# Patient Record
Sex: Female | Born: 2000 | Race: White | Hispanic: No | Marital: Single | Smoking: Never smoker
Health system: Southern US, Community
[De-identification: ages and names within clinical notes are randomized; demographics above are authoritative.]

## PROBLEM LIST (undated history)

## (undated) DIAGNOSIS — G43909 Migraine, unspecified, not intractable, without status migrainosus: Secondary | ICD-10-CM

## (undated) DIAGNOSIS — Z9889 Other specified postprocedural states: Secondary | ICD-10-CM

## (undated) DIAGNOSIS — F909 Attention-deficit hyperactivity disorder, unspecified type: Secondary | ICD-10-CM

## (undated) DIAGNOSIS — Z973 Presence of spectacles and contact lenses: Secondary | ICD-10-CM

## (undated) DIAGNOSIS — K219 Gastro-esophageal reflux disease without esophagitis: Secondary | ICD-10-CM

## (undated) DIAGNOSIS — N809 Endometriosis, unspecified: Secondary | ICD-10-CM

## (undated) DIAGNOSIS — IMO0001 Reserved for inherently not codable concepts without codable children: Secondary | ICD-10-CM

## (undated) HISTORY — PX: WISDOM TOOTH EXTRACTION: SHX21

## (undated) HISTORY — PX: KNEE SURGERY: SHX244

## (undated) HISTORY — PX: NO PAST SURGERIES: SHX2092

---
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
## 2021-01-13 DIAGNOSIS — R55 Syncope and collapse: Secondary | ICD-10-CM | POA: Diagnosis not present

## 2021-01-13 DIAGNOSIS — R002 Palpitations: Secondary | ICD-10-CM | POA: Diagnosis not present

## 2021-01-13 DIAGNOSIS — G43709 Chronic migraine without aura, not intractable, without status migrainosus: Secondary | ICD-10-CM | POA: Diagnosis not present

## 2021-02-03 DIAGNOSIS — L700 Acne vulgaris: Secondary | ICD-10-CM | POA: Diagnosis not present

## 2021-02-03 DIAGNOSIS — L720 Epidermal cyst: Secondary | ICD-10-CM | POA: Diagnosis not present

## 2021-02-03 DIAGNOSIS — L988 Other specified disorders of the skin and subcutaneous tissue: Secondary | ICD-10-CM | POA: Diagnosis not present

## 2021-02-03 DIAGNOSIS — L74510 Primary focal hyperhidrosis, axilla: Secondary | ICD-10-CM | POA: Diagnosis not present

## 2021-02-06 DIAGNOSIS — J0100 Acute maxillary sinusitis, unspecified: Secondary | ICD-10-CM | POA: Diagnosis not present

## 2021-02-09 ENCOUNTER — Other Ambulatory Visit: Payer: Self-pay

## 2021-02-09 ENCOUNTER — Ambulatory Visit
Admission: EM | Admit: 2021-02-09 | Discharge: 2021-02-09 | Disposition: A | Discharge status: Home / Self Care | Payer: BC Managed Care – PPO

## 2021-02-09 DIAGNOSIS — R051 Acute cough: Secondary | ICD-10-CM

## 2021-02-09 MED ORDER — BENZONATATE 100 MG PO CAPS: 200.0000 mg | ORAL_CAPSULE | Freq: Three times a day (TID) | ORAL | 0 refills | Status: DC

## 2021-02-09 MED ORDER — IPRATROPIUM BROMIDE 0.06 % NA SOLN: 2.0000 | Freq: Four times a day (QID) | NASAL | 12 refills | Status: DC

## 2021-02-09 MED ORDER — PREDNISONE 10 MG (21) PO TBPK: ORAL_TABLET | ORAL | 0 refills | Status: DC

## 2021-02-09 NOTE — ED Triage Notes (Signed)
Pt reports cough x approx 10 days.  Was seen at [facility] last week and given augmentin and promethazine dm but it has not helped.  Also has some lingering nasal congestion. Denies fever, HA.  Had negative home COVID. Reports rib pain from coughing.

## 2021-02-09 NOTE — ED Provider Notes (Signed)
[facility]    CSN: [id number] Arrival date & time: 02/09/21  2703      History   Chief Complaint Chief Complaint  Patient presents with   Cough    HPI Paula Brock is a 20 y.o. female.   HPI  20 year old female here for evaluation of cough.  Patient was that she has been having a cough for the past 2 weeks.  Her cough is productive for a green sputum.  She is also having runny nose and nasal congestion with green nasal discharge.  She endorses a sore throat and some intermittent posttussive emesis.  Patient denies any fever, ear pain or pressure, shortness breath or wheezing, or GI complaints.  She was evaluated by [facility] and was placed on Augmentin and Promethazine DM cough syrup for maxillary sinusitis.  She tested negative at home for COVID.  Patient reports that the Promethazine DM is not helping her and she is continuing to cough all throughout the day.  Patient has had a mild, nonproductive cough during the history and physical.  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      Home Medications    Prior to Admission medications   Medication Sig Start Date End Date Taking? Authorizing Provider  amitriptyline (ELAVIL) 10 MG tablet Take by mouth. 01/03/20  Yes [provider]  amoxicillin-clavulanate  (AUGMENTIN) 875-125 MG tablet Take 1 tablet by mouth 2 (two) times daily.   Yes [provider]  benzonatate (TESSALON) 100 MG capsule Take 2 capsules (200 mg total) by mouth every 8 (eight) hours. 02/09/21  Yes Becky Augusta, NP  cetirizine (ZYRTEC) 10 MG tablet Take 10 mg by mouth daily.   Yes [provider]  ipratropium (ATROVENT) 0.06 % nasal spray Place 2 sprays into both nostrils 4 (four) times daily. 02/09/21  Yes Becky Augusta, NP  norethindrone-ethinyl estradiol (LOESTRIN) 1-20 MG-MCG tablet Take 1 tablet by mouth daily. 07/25/17  Yes [provider]  ondansetron (ZOFRAN-ODT) 4 MG disintegrating tablet Take 4 mg by mouth every 8 (eight) hours as needed. 03/03/18  Yes [provider]  predniSONE (STERAPRED UNI-PAK 21 TAB) 10 MG (21) TBPK tablet Take 6 tablets on day 1, 5 tablets day 2, 4 tablets day 3, 3 tablets day 4, 2 tablets day 5, 1 tablet day 6 02/09/21  Yes Becky Augusta, NP  promethazine-dextromethorphan (PROMETHAZINE-DM) 6.25-15 MG/5ML syrup Take by mouth 4 (four) times daily as needed for cough.   Yes [provider]  SUMAtriptan (IMITREX) 100 MG tablet Take 50 mg by mouth daily as needed. 11/14/17  Yes [provider]    Family History Family History  Problem Relation Age of Onset   Healthy Mother    Healthy Father     Social History Social History  Tobacco Use   Smoking status: Never   Smokeless tobacco: Never  Vaping Use   Vaping Use: Never used  Substance Use Topics   Alcohol use: No    Alcohol/week: 0.0 standard drinks   Drug use: Not Currently     Allergies   Latex   Review of Systems Review of Systems  Constitutional:  Negative for activity change, appetite change and fever.  HENT:  Positive for congestion, rhinorrhea and sore throat. Negative for ear pain.   Respiratory:  Positive for cough. Negative for shortness of breath and wheezing.   Gastrointestinal:  Negative for constipation, diarrhea, nausea  and vomiting.  Skin:  Negative for rash.  Hematological: Negative.   Psychiatric/Behavioral: Negative.      Physical Exam Triage Vital Signs ED Triage Vitals  Enc Vitals Group     BP 02/09/21 0847 117/74     Pulse Rate 02/09/21 0847 89     Resp 02/09/21 0847 18     Temp 02/09/21 0847 98.3 F (36.8 C)     Temp Source 02/09/21 0847 Oral     SpO2 02/09/21 0847 99 %     Weight --      Height --      Head Circumference --      Peak Flow --      Pain Score 02/09/21 0841 3     Pain Loc --      Pain Edu? --      Excl. in GC? --    No data found.  Updated Vital Signs BP 117/74 (BP Location: Right Arm)   Pulse 89   Temp 98.3 F (36.8 C) (Oral)   Resp 18   SpO2 99%   Visual Acuity Right Eye Distance:   Left Eye Distance:   Bilateral Distance:    Right Eye Near:   Left Eye Near:    Bilateral Near:     Physical Exam Vitals and nursing note reviewed.  Constitutional:      General: She is not in acute distress.    Appearance: Normal appearance. She is normal weight. She is not ill-appearing.  HENT:     Head: Normocephalic and atraumatic.     Right Ear: Tympanic membrane, ear canal and external ear normal. There is no impacted cerumen.     Left Ear: Tympanic membrane, ear canal and external ear normal. There is no impacted cerumen.     Nose: Congestion present. No rhinorrhea.     Comments: Nasal mucosa is erythematous and edematous.  No discharge appreciated.    Mouth/Throat:     Mouth: Mucous membranes are moist.     Pharynx: Oropharynx is clear. Posterior oropharyngeal erythema present.  Cardiovascular:     Rate and Rhythm: Normal rate and regular rhythm.     Pulses: Normal pulses.     Heart sounds: Normal heart sounds. No murmur heard.   No gallop.  Pulmonary:     Effort: Pulmonary effort is normal.     Breath sounds: Normal breath sounds. No wheezing, rhonchi or rales.  Musculoskeletal:     Cervical back: Normal range of motion and neck supple.   Lymphadenopathy:     Cervical: Cervical adenopathy present.  Skin:    General: Skin is warm and dry.     Capillary Refill: Capillary refill takes less than 2 seconds.     Findings: No erythema or rash.  Neurological:     General: No focal deficit present.     Mental Status: She  is alert and oriented to person, place, and time.  Psychiatric:        Mood and Affect: Mood normal.        Behavior: Behavior normal.        Thought Content: Thought content normal.        Judgment: Judgment normal.     UC Treatments / Results  Labs (all labs ordered are listed, but only abnormal results are displayed) Labs Reviewed - No data to display  EKG   Radiology No results found.  Procedures Procedures (including critical care time)  Medications Ordered in UC Medications - No data to display  Initial Impression / Assessment and Plan / UC Course  I have reviewed the triage vital signs and the nursing notes.  Pertinent labs & imaging results that were available during my care of the patient were reviewed by me and considered in my medical decision making (see chart for details).  Pleasant, nontoxic-appearing 20 year old female here for evaluation of cough that is been present for last 13 days.  She was evaluated by her primary care and started on Augmentin and Promethazine DM cough syrup without resolution of her cough.  Patient's physical exam reveals pearly gray tympanic membranes bilaterally with normal light reflex and clear external auditory canals.  Nasal mucosa is erythematous and edematous without any nasal discharge present.  There is no tenderness to percussion of maxillary or frontal sinuses.  Oropharyngeal exam reveals mild clear postnasal drip without significant erythema or injection though there is mild erythema present.  Patient does have bilateral anterior cervical lymphadenopathy on exam.  Cardiopulmonary exam reveals clear lung sounds in all fields.  I suspect that patient's  cough is being driven by postnasal drip and I have offered her Tessalon Perles as a bronchial anesthetic to help with the cough as well as Atrovent nasal spray to help during the day.  We will also do a trial of prednisone to see if that helps with chest and sinus inflammation.   Final Clinical Impressions(s) / UC Diagnoses   Final diagnoses:  Acute cough     Discharge Instructions      Use the Atrovent nasal spray, 2 squirts in each nostril every 6 hours, as needed for runny nose and postnasal drip.  Use the Tessalon Perles every 8 hours during the day.  Take them with a small sip of water.  They may give you some numbness to the base of your tongue or a metallic taste in your mouth, this is normal.  Use the Promethazine DM cough syrup at bedtime for cough and congestion.  It will make you drowsy so do not take it during the day.  Return for reevaluation or see your primary care provider for any new or worsening symptoms.      ED Prescriptions     Medication Sig Dispense Auth. Provider   benzonatate (TESSALON) 100 MG capsule Take 2 capsules (200 mg total) by mouth every 8 (eight) hours. 21 capsule Becky Augusta, NP   predniSONE (STERAPRED UNI-PAK 21 TAB) 10 MG (21) TBPK tablet Take 6 tablets on day 1, 5 tablets day 2, 4 tablets day 3, 3 tablets day 4, 2 tablets day 5, 1 tablet day 6 21 tablet Becky Augusta, NP   ipratropium (ATROVENT) 0.06 % nasal spray Place 2 sprays into both nostrils 4 (four) times daily. 15 mL Becky Augusta, NP      PDMP not reviewed this encounter.   Becky Augusta, NP 02/09/21 [phone]

## 2021-02-09 NOTE — Discharge Instructions (Addendum)
Use the Atrovent nasal spray, 2 squirts in each nostril every 6 hours, as needed for runny nose and postnasal drip.  Use the Tessalon Perles every 8 hours during the day.  Take them with a small sip of water.  They may give you some numbness to the base of your tongue or a metallic taste in your mouth, this is normal.  Use the Promethazine DM cough syrup at bedtime for cough and congestion.  It will make you drowsy so do not take it during the day.  Take the prednisone according to the package instructions.  You will take it for 6 days.  Always take it with breakfast and food.  Return for reevaluation or see your primary care provider for any new or worsening symptoms.

## 2021-02-16 ENCOUNTER — Encounter: Payer: Self-pay | Admitting: Nurse Practitioner

## 2021-02-16 ENCOUNTER — Ambulatory Visit: Payer: Self-pay

## 2021-02-16 ENCOUNTER — Telehealth: Payer: Self-pay

## 2021-02-16 DIAGNOSIS — R002 Palpitations: Secondary | ICD-10-CM | POA: Insufficient documentation

## 2021-02-16 DIAGNOSIS — R102 Pelvic and perineal pain: Secondary | ICD-10-CM | POA: Insufficient documentation

## 2021-02-16 NOTE — Telephone Encounter (Signed)
Returned pt's call.   Pt has had a cough for several weeks. She has bee to 2 [facility] and is still sick.   Currently pt has no PCP as she aged out of peds.   Pt is to start seeing Jolene 1/23 as her PCP.  Appointment made to see [facility] 02/17/2021.

## 2021-02-17 ENCOUNTER — Other Ambulatory Visit: Payer: Self-pay

## 2021-02-17 ENCOUNTER — Encounter: Payer: Self-pay | Admitting: Nurse Practitioner

## 2021-02-17 ENCOUNTER — Ambulatory Visit (INDEPENDENT_AMBULATORY_CARE_PROVIDER_SITE_OTHER): Payer: BC Managed Care – PPO | Admitting: Nurse Practitioner

## 2021-02-17 VITALS — BP 90/61 | HR 105 | Ht 64.0 in | Wt 119.2 lb

## 2021-02-17 DIAGNOSIS — R051 Acute cough: Secondary | ICD-10-CM | POA: Diagnosis not present

## 2021-02-17 DIAGNOSIS — Z7689 Persons encountering health services in other specified circumstances: Secondary | ICD-10-CM | POA: Diagnosis not present

## 2021-02-17 DIAGNOSIS — Z8659 Personal history of other mental and behavioral disorders: Secondary | ICD-10-CM

## 2021-02-17 DIAGNOSIS — G43019 Migraine without aura, intractable, without status migrainosus: Secondary | ICD-10-CM | POA: Diagnosis not present

## 2021-02-17 MED ORDER — AZITHROMYCIN 250 MG PO TABS: ORAL_TABLET | ORAL | 0 refills | Status: AC

## 2021-02-17 MED ORDER — ALBUTEROL SULFATE HFA 108 (90 BASE) MCG/ACT IN AERS: 2.0000 | INHALATION_SPRAY | Freq: Four times a day (QID) | RESPIRATORY_TRACT | 0 refills | Status: DC | PRN

## 2021-02-17 MED ORDER — PREDNISONE 20 MG PO TABS: 20.0000 mg | ORAL_TABLET | Freq: Every day | ORAL | 0 refills | Status: AC

## 2021-02-17 NOTE — Assessment & Plan Note (Signed)
History of in childhood -- did not tolerate any medications.  Continue to monitor.

## 2021-02-17 NOTE — Telephone Encounter (Signed)
Patient was seen in office 02/17/2021.

## 2021-02-17 NOTE — Assessment & Plan Note (Signed)
Ongoing for 3 weeks -- at this time will obtain imaging.  She is negative for Covid on home testing.  ?RSV.  Start ZPack and Prednisone 20 MG daily for 5 days.  Albuterol inhaler to use as needed sent.  Recommend: - Increased rest - Increasing Fluids - Acetaminophen / ibuprofen as needed for fever/pain.  - Salt water gargling, chloraseptic spray and throat lozenges - Mucinex.  Return to office in 2 weeks for follow-up.

## 2021-02-17 NOTE — Patient Instructions (Signed)

## 2021-02-17 NOTE — Assessment & Plan Note (Signed)
Chronic, ongoing.  Followed by neurology, will continue this collaboration and current medication regimen as prescribed by them.  Recent note reviewed.

## 2021-02-17 NOTE — Progress Notes (Addendum)
New Patient Office Visit  Subjective:  Patient ID: Paula Brock, female    DOB: May 29, 2000  Age: 20 y.o. MRN: [id number]  CC:  Chief Complaint  Patient presents with   Cough    Patient is here for a lingering cold. Patient states she started off with sinus symptoms, then she had a cough and sore throat. Patient states her cough got really deep and she went to 2 different urgent cares and was prescribed cough medicine and neither of them helped it. Patient states she has lingering cough. Patient states she tested the second week of being symptomatic and it was negative with an at-home test.     HPI Paula Brock presents for new patient visit to establish care.  Introduced to Publishing rights manager role and practice setting.  All questions answered.  Discussed provider/patient relationship and expectations.  Previously followed by Dr. Tracey Harries, moving out of pediatrics.   Has history of ADHD -- took medication when younger, but had bad reactions.  MIGRAINES Followed by neurology for this -- taking Amitriptyline 10 MG & Nurtec + Imitrex.  Follows with GYN for birth control, last saw in July 2022, taking Loestrin. Duration: chronic Onset: gradual Severity: 9/10 Quality: dull, aching, and throbbing Frequency: intermittent Location: back of neck and head Headache duration: 24 hours Radiation: no Time of day headache occurs:  Alleviating factors:  Aggravating factors:  Headache status at time of visit: asymptomatic Treatments attempted: Treatments attempted:  shots, APAP, ibuprofen, triptans, and amitriptyline   Aura: no Nausea:  yes Vomiting: yes Photophobia:  yes Phonophobia:  yes Effect on social functioning:  no Numbers of missed days of school/work each month: none Confusion:  no Gait disturbance/ataxia:  no Behavioral changes:  no Fevers:  no   COUGH Has had a cough for 3 weeks -- has been to two urgent cares.  Was given Amoxicillin, was not able to take due to  throat swelling at time, + Tessalon and Promethazine cough syrup.  Was give Prednisone, but did not take as makes her sick.  Was negative for Covid.  Morning and night are the worst, coughs to point of throwing up at times.  No history of asthma.  Has history of Covid in January. Duration: weeks Circumstances of initial development of cough: URI Cough severity: moderate Cough description: non-productive and hacking Aggravating factors:  worse at night and worse in the AM Alleviating factors: nothing Status:  fluctuating Treatments attempted: cold/sinus, mucinex, cough syrup, and antibiotics Wheezing:  a little bit at night Shortness of breath:  a little at night Chest pain: no Chest tightness:yes Nasal congestion: yes Runny nose: yes Postnasal drip: yes Frequent throat clearing or swallowing: yes Hemoptysis: no Fevers: no Night sweats: no Weight loss: no Heartburn: no Recent foreign travel: no Tuberculosis contacts: no   Past Medical History:  Diagnosis Date   ADHD (attention deficit hyperactivity disorder)    Endometriosis    Migraines    Reflux    occasionally   Wears contact lenses     Past Surgical History:  Procedure Laterality Date   KNEE ARTHROSCOPY Left 04/09/2015   Procedure: ARTHROSCOPY KNEE WITH DEBRIDEMENT with LATERAL RELEASE;  Surgeon: Christena Flake, MD;  Location: [facility];  Service: Orthopedics;  Laterality: Left;  Latex   NO PAST SURGERIES      Family History  Problem Relation Age of Onset   Healthy Mother    Heart disease Father    Healthy Father    Cancer  Maternal Grandfather    Diabetes Paternal Grandfather     Social History   Socioeconomic History   Marital status: Single    Spouse name: Not on file   Number of children: Not on file   Years of education: Not on file   Highest education level: Not on file  Occupational History   Occupation: real estate  Tobacco Use   Smoking status: Never   Smokeless tobacco: Never  Vaping  Use   Vaping Use: Never used  Substance and Sexual Activity   Alcohol use: No    Alcohol/week: 0.0 standard drinks   Drug use: Not Currently   Sexual activity: Not Currently  Other Topics Concern   Not on file  Social History Narrative   Not on file   Social Determinants of Health   Financial Resource Strain: Low Risk    Difficulty of Paying Living Expenses: Not very hard  Food Insecurity: No Food Insecurity   Worried About Programme researcher, broadcasting/film/video in the Last Year: Never true   Ran Out of Food in the Last Year: Never true  Transportation Needs: No Transportation Needs   Lack of Transportation (Medical): No   Lack of Transportation (Non-Medical): No  Physical Activity: Sufficiently Active   Days of Exercise per Week: 3 days   Minutes of Exercise per Session: 60 min  Stress: No Stress Concern Present   Feeling of Stress : Only a little  Social Connections: Moderately Isolated   Frequency of Communication with Friends and Family: More than three times a week   Frequency of Social Gatherings with Friends and Family: More than three times a week   Attends Religious Services: More than 4 times per year   Active Member of [employer] or Organizations: No   Attends Banker Meetings: Never   Marital Status: Never married  Catering manager Violence: Not At Risk   Fear of Current or Ex-Partner: No   Emotionally Abused: No   Physically Abused: No   Sexually Abused: No    ROS Review of Systems  Constitutional:  Positive for fatigue. Negative for activity change, appetite change and fever.  HENT:  Positive for congestion, postnasal drip and rhinorrhea. Negative for ear discharge, ear pain, facial swelling, sinus pressure, sinus pain, sneezing, sore throat and voice change.   Respiratory:  Positive for cough, chest tightness and wheezing. Negative for shortness of breath.   Cardiovascular:  Negative for chest pain, palpitations and leg swelling.  Gastrointestinal: Negative.    Endocrine: Negative.   Neurological: Negative.   Psychiatric/Behavioral: Negative.     Objective:   Today's Vitals: BP 90/61   Pulse (!) 105   Ht 5\' 4"  (1.626 m)   Wt 119 lb 3.2 oz (54.1 kg)   SpO2 98%   BMI 20.46 kg/m   Physical Exam Vitals and nursing note reviewed.  Constitutional:      General: She is awake. She is not in acute distress.    Appearance: She is well-developed and well-groomed. She is not ill-appearing or toxic-appearing.  HENT:     Head: Normocephalic.     Right Ear: Hearing, ear canal and external ear normal. A middle ear effusion is present.     Left Ear: Hearing, ear canal and external ear normal. A middle ear effusion is present.     Nose: Rhinorrhea present. Rhinorrhea is clear.     Right Sinus: No maxillary sinus tenderness or frontal sinus tenderness.     Left Sinus: No maxillary  sinus tenderness or frontal sinus tenderness.     Mouth/Throat:     Mouth: Mucous membranes are moist.     Pharynx: Oropharynx is clear.     Tonsils: 0 on the right. 0 on the left.  Eyes:     General: Lids are normal.        Right eye: No discharge.        Left eye: No discharge.     Conjunctiva/sclera: Conjunctivae normal.     Pupils: Pupils are equal, round, and reactive to light.  Neck:     Thyroid: No thyromegaly.     Vascular: No carotid bruit.  Cardiovascular:     Rate and Rhythm: Normal rate and regular rhythm.     Heart sounds: Normal heart sounds. No murmur heard.   No gallop.  Pulmonary:     Effort: Pulmonary effort is normal. No accessory muscle usage or respiratory distress.     Breath sounds: Normal breath sounds.  Abdominal:     General: Bowel sounds are normal.     Palpations: Abdomen is soft.  Musculoskeletal:     Cervical back: Normal range of motion and neck supple.     Right lower leg: No edema.     Left lower leg: No edema.  Lymphadenopathy:     Cervical: No cervical adenopathy.  Skin:    General: Skin is warm and dry.  Neurological:      Mental Status: She is alert and oriented to person, place, and time.  Psychiatric:        Attention and Perception: Attention normal.        Mood and Affect: Mood normal.        Speech: Speech normal.        Behavior: Behavior normal. Behavior is cooperative.        Thought Content: Thought content normal.   Assessment & Plan:   Problem List Items Addressed This Visit       Cardiovascular and Mediastinum   Intractable migraine without aura and without status migrainosus    Chronic, ongoing.  Followed by neurology, will continue this collaboration and current medication regimen as prescribed by them.  Recent note reviewed.      Relevant Medications   NURTEC 75 MG TBDP     Other   History of ADHD    History of in childhood -- did not tolerate any medications.  Continue to monitor.      Acute cough    Ongoing for 3 weeks -- at this time will obtain imaging.  She is negative for Covid on home testing.  ?RSV.  Start ZPack and Prednisone 20 MG daily for 5 days.  Albuterol inhaler to use as needed sent.  Recommend: - Increased rest - Increasing Fluids - Acetaminophen / ibuprofen as needed for fever/pain.  - Salt water gargling, chloraseptic spray and throat lozenges - Mucinex.  Return to office in 2 weeks for follow-up.      Relevant Orders   DG Chest 2 View   Other Visit Diagnoses     Encounter to establish care    -  Primary       Outpatient Encounter Medications as of 02/17/2021  Medication Sig   albuterol (VENTOLIN HFA) 108 (90 Base) MCG/ACT inhaler Inhale 2 puffs into the lungs every 6 (six) hours as needed for wheezing or shortness of breath.   amitriptyline (ELAVIL) 10 MG tablet Take by mouth.   azithromycin (ZITHROMAX) 250 MG tablet Take 2 tablets  on day 1, then 1 tablet daily on days 2 through 5   benzonatate (TESSALON) 100 MG capsule Take 2 capsules (200 mg total) by mouth every 8 (eight) hours.   cetirizine (ZYRTEC) 10 MG tablet Take 10 mg by mouth daily.    glycopyrrolate (ROBINUL) 2 MG tablet Take 2 mg by mouth 2 (two) times daily.   ipratropium (ATROVENT) 0.06 % nasal spray Place 2 sprays into both nostrils 4 (four) times daily.   norethindrone-ethinyl estradiol (LOESTRIN) 1-20 MG-MCG tablet Take 1 tablet by mouth daily.   NURTEC 75 MG TBDP Take 1 tablet by mouth daily as needed.   ondansetron (ZOFRAN-ODT) 4 MG disintegrating tablet Take 4 mg by mouth every 8 (eight) hours as needed.   predniSONE (DELTASONE) 20 MG tablet Take 1 tablet (20 mg total) by mouth daily with breakfast for 5 days.   promethazine-dextromethorphan (PROMETHAZINE-DM) 6.25-15 MG/5ML syrup Take by mouth 4 (four) times daily as needed for cough.   SUMAtriptan (IMITREX) 100 MG tablet Take 50 mg by mouth daily as needed.   amoxicillin-clavulanate (AUGMENTIN) 875-125 MG tablet Take 1 tablet by mouth 2 (two) times daily. (Patient not taking: Reported on 02/17/2021)   [DISCONTINUED] predniSONE (STERAPRED UNI-PAK 21 TAB) 10 MG (21) TBPK tablet Take 6 tablets on day 1, 5 tablets day 2, 4 tablets day 3, 3 tablets day 4, 2 tablets day 5, 1 tablet day 6 (Patient not taking: Reported on 02/17/2021)   No facility-administered encounter medications on file as of 02/17/2021.    Follow-up: Return in about 2 weeks (around 03/03/2021) for Cough.   Marjie Skiff, NP

## 2021-02-19 ENCOUNTER — Other Ambulatory Visit: Payer: Self-pay

## 2021-02-19 ENCOUNTER — Ambulatory Visit
Admission: RE | Admit: 2021-02-19 | Discharge: 2021-02-19 | Disposition: A | Discharge status: Home / Self Care | Payer: BC Managed Care – PPO | Source: Home / Self Care | Attending: Nurse Practitioner | Admitting: Nurse Practitioner

## 2021-02-19 ENCOUNTER — Ambulatory Visit
Admission: RE | Admit: 2021-02-19 | Discharge: 2021-02-19 | Disposition: A | Discharge status: Home / Self Care | Payer: BC Managed Care – PPO | Source: Ambulatory Visit | Attending: Nurse Practitioner | Admitting: Nurse Practitioner

## 2021-02-19 DIAGNOSIS — R0602 Shortness of breath: Secondary | ICD-10-CM | POA: Diagnosis not present

## 2021-02-19 DIAGNOSIS — R062 Wheezing: Secondary | ICD-10-CM | POA: Diagnosis not present

## 2021-02-19 DIAGNOSIS — R051 Acute cough: Secondary | ICD-10-CM

## 2021-02-19 DIAGNOSIS — R059 Cough, unspecified: Secondary | ICD-10-CM | POA: Diagnosis not present

## 2021-02-22 NOTE — Progress Notes (Signed)
Contacted via MyChart   Good morning Paula Brock, your chest x-ray has returned and on report & review of images by myself no pneumonia or acute findings present.  Suspect a little bronchitis present.  Are you taking the antibiotic and did you complete Prednisone, please ensure you do both as this will be beneficial for cough.  Any questions? Keep being awesome!!  Thank you for allowing me to participate in your care.  I appreciate you. Kindest regards, Nayib Remer

## 2021-02-24 DIAGNOSIS — R6889 Other general symptoms and signs: Secondary | ICD-10-CM | POA: Diagnosis not present

## 2021-02-24 DIAGNOSIS — G44019 Episodic cluster headache, not intractable: Secondary | ICD-10-CM | POA: Diagnosis not present

## 2021-02-24 DIAGNOSIS — G43019 Migraine without aura, intractable, without status migrainosus: Secondary | ICD-10-CM | POA: Diagnosis not present

## 2021-03-13 ENCOUNTER — Ambulatory Visit (INDEPENDENT_AMBULATORY_CARE_PROVIDER_SITE_OTHER): Payer: BC Managed Care – PPO | Admitting: Nurse Practitioner

## 2021-03-13 ENCOUNTER — Other Ambulatory Visit: Payer: Self-pay

## 2021-03-13 ENCOUNTER — Encounter: Payer: Self-pay | Admitting: Nurse Practitioner

## 2021-03-13 DIAGNOSIS — R051 Acute cough: Secondary | ICD-10-CM

## 2021-03-13 MED ORDER — MONTELUKAST SODIUM 10 MG PO TABS: 10.0000 mg | ORAL_TABLET | Freq: Every day | ORAL | 12 refills | Status: DC

## 2021-03-13 NOTE — Progress Notes (Signed)
BP 91/60   Pulse 91   Temp 97.9 F (36.6 C) (Oral)   Wt 120 lb 9.6 oz (54.7 kg)   SpO2 97%   BMI 20.70 kg/m    Subjective:    Patient ID: Paula Brock, female    DOB: October 29, 2000, 20 y.o.   MRN: [id number]  HPI: Paula Brock is a 20 y.o. female  Chief Complaint  Patient presents with   Cough    Patient is here to follow up on a cough. Patient states she still has the cough and states she has a sore throat every once in a while from coughing. Patient states she is a little better.    COUGH Cough continues to linger, was treated with Azithromycin and Prednisone + Albuterol on 02/17/21.  At this time is 60% better.  On days it is a little worse uses inhaler, which offers benefit.  She continues to use Claritin and Albuterol daily, occasionally uses Flonase.  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   Duration: months Circumstances of initial development of cough: URI Cough severity: mild Cough description: dry Aggravating factors:  worse at night Alleviating factors: albuterol Status:  better Treatments attempted:  Claritin and Prednisone, antibiotics, and albuterol Wheezing: no Shortness of breath: no Chest pain: no Chest tightness:no Nasal congestion: no Runny nose: no Postnasal drip: no Frequent throat clearing or swallowing: no Hemoptysis: no Fevers: no Night sweats: no Weight loss: no Heartburn: no Recent foreign travel: no Tuberculosis contacts: no   Relevant past medical, surgical, family and social history reviewed and updated as indicated. Interim medical history since our last visit reviewed. Allergies and medications reviewed and updated.  Review of Systems  Constitutional:  Negative for activity change, appetite change, fatigue and fever.  HENT: Negative.    Respiratory:  Positive for cough. Negative for chest tightness, shortness of breath and wheezing.   Cardiovascular:  Negative for chest pain, palpitations and leg swelling.   Gastrointestinal: Negative.   Endocrine: Negative.   Neurological: Negative.   Psychiatric/Behavioral: Negative.     Per HPI unless specifically indicated above     Objective:    BP 91/60   Pulse 91   Temp 97.9 F (36.6 C) (Oral)   Wt 120 lb 9.6 oz (54.7 kg)   SpO2 97%   BMI 20.70 kg/m   Wt Readings from Last 3 Encounters:  03/13/21 120 lb 9.6 oz (54.7 kg) (36 %, Z= -0.36)*  02/17/21 119 lb 3.2 oz (54.1 kg) (33 %, Z= -0.44)*  02/28/20 108 lb 0.4 oz (49 kg) (15 %, Z= -1.05)*   * Growth percentiles are based on CDC (Girls, 2-20 Years) data.    Physical Exam Vitals and nursing note reviewed.  Constitutional:      General: She is awake. She is not in acute distress.    Appearance: She is well-developed and well-groomed. She is not ill-appearing or toxic-appearing.  HENT:     Head: Normocephalic.     Right Ear: Hearing, ear canal and external ear normal. A middle ear effusion is present.     Left Ear: Hearing, ear canal and external ear normal. A middle ear effusion is present.     Nose: No rhinorrhea.     Right Sinus: No maxillary sinus tenderness or frontal sinus tenderness.     Left Sinus: No maxillary sinus tenderness or frontal sinus tenderness.     Mouth/Throat:     Mouth: Mucous membranes are moist.  Pharynx: Oropharynx is clear.  Eyes:     General: Lids are normal.        Right eye: No discharge.        Left eye: No discharge.     Conjunctiva/sclera: Conjunctivae normal.     Pupils: Pupils are equal, round, and reactive to light.  Neck:     Thyroid: No thyromegaly.     Vascular: No carotid bruit.  Cardiovascular:     Rate and Rhythm: Normal rate and regular rhythm.     Heart sounds: Normal heart sounds. No murmur heard.   No gallop.  Pulmonary:     Effort: Pulmonary effort is normal. No accessory muscle usage or respiratory distress.     Breath sounds: Normal breath sounds.  Abdominal:     General: Bowel sounds are normal.     Palpations: Abdomen is  soft.  Musculoskeletal:     Cervical back: Normal range of motion and neck supple.     Right lower leg: No edema.     Left lower leg: No edema.  Lymphadenopathy:     Cervical: No cervical adenopathy.  Skin:    General: Skin is warm and dry.  Neurological:     Mental Status: She is alert and oriented to person, place, and time.  Psychiatric:        Attention and Perception: Attention normal.        Mood and Affect: Mood normal.        Speech: Speech normal.        Behavior: Behavior normal. Behavior is cooperative.        Thought Content: Thought content normal.    Results for orders placed or performed during the hospital encounter of 03/08/18  Group A Strep by PCR   Specimen: Throat; Sterile Swab  Result Value Ref Range   Group A Strep by PCR NOT DETECTED NOT DETECTED  Basic metabolic panel  Result Value Ref Range   Sodium 137 135 - 145 mmol/L   Potassium 3.9 3.5 - 5.1 mmol/L   Chloride 103 98 - 111 mmol/L   CO2 23 22 - 32 mmol/L   Glucose, Bld 91 70 - 99 mg/dL   BUN 16 4 - 18 mg/dL   Creatinine, Ser 3.29 0.50 - 1.00 mg/dL   Calcium 9.3 8.9 - 51.8 mg/dL   GFR calc non Af Amer NOT CALCULATED >60 mL/min   GFR calc Af Amer NOT CALCULATED >60 mL/min   Anion gap 11 5 - 15  CBC with Differential  Result Value Ref Range   WBC 4.8 4.5 - 13.5 K/uL   RBC 4.40 3.80 - 5.70 MIL/uL   Hemoglobin 13.7 12.0 - 16.0 g/dL   HCT 84.1 66.0 - 63.0 %   MCV 89.5 78.0 - 98.0 fL   MCH 31.1 25.0 - 34.0 pg   MCHC 34.8 31.0 - 37.0 g/dL   RDW 16.0 10.9 - 32.3 %   Platelets 253 150 - 400 K/uL   nRBC 0.0 0.0 - 0.2 %   Neutrophils Relative % 55 %   Neutro Abs 2.6 1.7 - 8.0 K/uL   Lymphocytes Relative 33 %   Lymphs Abs 1.6 1.1 - 4.8 K/uL   Monocytes Relative 11 %   Monocytes Absolute 0.5 0.2 - 1.2 K/uL   Eosinophils Relative 1 %   Eosinophils Absolute 0.1 0.0 - 1.2 K/uL   Basophils Relative 0 %   Basophils Absolute 0.0 0.0 - 0.1 K/uL   Immature Granulocytes 0 %  Abs Immature Granulocytes  0.01 0.00 - 0.07 K/uL      Assessment & Plan:   Problem List Items Addressed This Visit       Other   Acute cough    About 60-70% improved at this time after treatment, suspect some lingering due to underlying allergic rhinitis.  Will trial Singulair to see if benefit with cough and allergies.  Educated her on this medication, including Black Box warning.  She is scheduled to return in January and will reassess then.        Follow up plan: Return for as scheduled in january.

## 2021-03-13 NOTE — Patient Instructions (Signed)

## 2021-03-13 NOTE — Assessment & Plan Note (Signed)
About 60-70% improved at this time after treatment, suspect some lingering due to underlying allergic rhinitis.  Will trial Singulair to see if benefit with cough and allergies.  Educated her on this medication, including Black Box warning.  She is scheduled to return in January and will reassess then.

## 2021-03-16 ENCOUNTER — Telehealth: Payer: Self-pay | Admitting: Nurse Practitioner

## 2021-03-16 NOTE — Telephone Encounter (Signed)
Copied from CRM [id number]. Topic: Complaint - Billing/Coding >> Mar 13, 2021 11:39 AM Randol Kern wrote: DOS: 02/17/2021 Details of complaint: Pt says that this bill was not coded appropriately per conversation with Billing and Insurance. Instructed to call the office  How would the patient like to see this issue resolved? Wants call back from office to discuss/resolve  Best contact: [phone]  Route to Practice Administrator.

## 2021-03-17 ENCOUNTER — Telehealth: Payer: Self-pay | Admitting: Nurse Practitioner

## 2021-03-17 ENCOUNTER — Ambulatory Visit: Payer: BC Managed Care – PPO | Admitting: Family Medicine

## 2021-03-17 NOTE — Telephone Encounter (Signed)
Copied from CRM [id number]. Topic: General - Other >> Mar 16, 2021 12:52 PM Traci Sermon wrote: Reason for CRM: Pt called in stating she talked to [facility] and the office at one point about a billing situation stated she needs to speak with someone about the coding that was incorrect. Please advise

## 2021-03-26 NOTE — Telephone Encounter (Signed)
Spoke with patient regarding diagnosis code concerns. Will send request to billing department to reprocess claim for DOS 02/17/21.

## 2021-04-28 ENCOUNTER — Ambulatory Visit: Payer: BC Managed Care – PPO | Admitting: Nurse Practitioner

## 2021-07-24 DIAGNOSIS — J301 Allergic rhinitis due to pollen: Secondary | ICD-10-CM | POA: Diagnosis not present

## 2021-07-24 DIAGNOSIS — J019 Acute sinusitis, unspecified: Secondary | ICD-10-CM | POA: Diagnosis not present

## 2021-07-25 NOTE — Patient Instructions (Signed)
Migraine Headache A migraine headache is a very strong throbbing pain on one side or both sides of your head. This type of headache can also cause other symptoms. It can last from 4 hours to 3 days. Talk with your doctor about what things may bring on (trigger) this condition. What are the causes? The exact cause of this condition is not known. This condition may be triggered or caused by: Drinking alcohol. Smoking. Taking medicines, such as: Medicine used to treat chest pain (nitroglycerin). Birth control pills. Estrogen. Some blood pressure medicines. Eating or drinking certain products. Doing physical activity. Other things that may trigger a migraine headache include: Having a menstrual period. Pregnancy. Hunger. Stress. Not getting enough sleep or getting too much sleep. Weather changes. Tiredness (fatigue). What increases the risk? Being 25-55 years old. Being female. Having a family history of migraine headaches. Being Caucasian. Having depression or anxiety. Being very overweight. What are the signs or symptoms? A throbbing pain. This pain may: Happen in any area of the head, such as on one side or both sides. Make it hard to do daily activities. Get worse with physical activity. Get worse around bright lights or loud noises. Other symptoms may include: Feeling sick to your stomach (nauseous). Vomiting. Dizziness. Being sensitive to bright lights, loud noises, or smells. Before you get a migraine headache, you may get warning signs (an aura). An aura may include: Seeing flashing lights or having blind spots. Seeing bright spots, halos, or zigzag lines. Having tunnel vision or blurred vision. Having numbness or a tingling feeling. Having trouble talking. Having weak muscles. Some people have symptoms after a migraine headache (postdromal phase), such as: Tiredness. Trouble thinking (concentrating). How is this treated? Taking medicines that: Relieve  pain. Relieve the feeling of being sick to your stomach. Prevent migraine headaches. Treatment may also include: Having acupuncture. Avoiding foods that bring on migraine headaches. Learning ways to control your body functions (biofeedback). Therapy to help you know and deal with negative thoughts (cognitive behavioral therapy). Follow these instructions at home: Medicines Take over-the-counter and prescription medicines only as told by your doctor. Ask your doctor if the medicine prescribed to you: Requires you to avoid driving or using heavy machinery. Can cause trouble pooping (constipation). You may need to take these steps to prevent or treat trouble pooping: Drink enough fluid to keep your pee (urine) pale yellow. Take over-the-counter or prescription medicines. Eat foods that are high in fiber. These include beans, whole grains, and fresh fruits and vegetables. Limit foods that are high in fat and sugar. These include fried or sweet foods. Lifestyle Do not drink alcohol. Do not use any products that contain nicotine or tobacco, such as cigarettes, e-cigarettes, and chewing tobacco. If you need help quitting, ask your doctor. Get at least 8 hours of sleep every night. Limit and deal with stress. General instructions   Keep a journal to find out what may bring on your migraine headaches. For example, write down: What you eat and drink. How much sleep you get. Any change in what you eat or drink. Any change in your medicines. If you have a migraine headache: Avoid things that make your symptoms worse, such as bright lights. It may help to lie down in a dark, quiet room. Do not drive or use heavy machinery. Ask your doctor what activities are safe for you. Keep all follow-up visits as told by your doctor. This is important. Contact a doctor if: You get a migraine headache that   is different or worse than others you have had. You have more than 15 headache days in one  month. Get help right away if: Your migraine headache gets very bad. Your migraine headache lasts longer than 72 hours. You have a fever. You have a stiff neck. You have trouble seeing. Your muscles feel weak or like you cannot control them. You start to lose your balance a lot. You start to have trouble walking. You pass out (faint). You have a seizure. Summary A migraine headache is a very strong throbbing pain on one side or both sides of your head. These headaches can also cause other symptoms. This condition may be treated with medicines and changes to your lifestyle. Keep a journal to find out what may bring on your migraine headaches. Contact a doctor if you get a migraine headache that is different or worse than others you have had. Contact your doctor if you have more than 15 headache days in a month. This information is not intended to replace advice given to you by your health care provider. Make sure you discuss any questions you have with your health care provider. Document Revised: 08/04/2018 Document Reviewed: 05/25/2018 Elsevier Patient Education  2022 Elsevier Inc.  

## 2021-07-27 ENCOUNTER — Encounter: Payer: Self-pay | Admitting: Nurse Practitioner

## 2021-07-27 ENCOUNTER — Ambulatory Visit (INDEPENDENT_AMBULATORY_CARE_PROVIDER_SITE_OTHER): Payer: BC Managed Care – PPO | Admitting: Nurse Practitioner

## 2021-07-27 VITALS — BP 110/66 | HR 72 | Temp 98.2°F | Ht 64.0 in | Wt 123.2 lb

## 2021-07-27 DIAGNOSIS — R051 Acute cough: Secondary | ICD-10-CM

## 2021-07-27 DIAGNOSIS — J309 Allergic rhinitis, unspecified: Secondary | ICD-10-CM | POA: Insufficient documentation

## 2021-07-27 DIAGNOSIS — J301 Allergic rhinitis due to pollen: Secondary | ICD-10-CM | POA: Diagnosis not present

## 2021-07-27 MED ORDER — MONTELUKAST SODIUM 10 MG PO TABS: 10.0000 mg | ORAL_TABLET | Freq: Every day | ORAL | 4 refills | Status: DC

## 2021-07-27 NOTE — Assessment & Plan Note (Signed)
Chronic, ongoing.  Continue current medication regimen and adjust as needed.  Singulair is offering benefit.  Collaboration with ENT continues and is scheduled for allergy testing. ?

## 2021-07-27 NOTE — Progress Notes (Signed)
? ?BP 110/66   Pulse 72   Temp 98.2 ?F (36.8 ?C) (Oral)   Ht 5\' 4"  (1.626 m)   Wt 123 lb 3.2 oz (55.9 kg)   SpO2 98%   BMI 21.15 kg/m?   ? ?Subjective:  ? ? Patient ID: Paula Brock, female    DOB: 2001-01-24, 21 y.o.   MRN: [id number] ? ?HPI: ?Paula Brock is a 21 y.o. female ? ?Chief Complaint  ?Patient presents with  ? Allergic Rhinitis   ?  Patient states she was recently seen by ENT and would like to discuss and follow up with provider at today's visit.   ? ?ALLERGIES ?Overall reports post Covid cough is improving.  Saw ENT ([facility]) on 07/24/21 who recommended allergy testing and treated for infection with Prednisone.  Father had to have allergy shots. ?Duration: chronic ?Symptoms occur seasonally: yes ?Symptoms occur perenially: no ?Satisfied with current treatment: yes ?Allergist evaluation in past: no ?Allergen injection immunotherapy: no ?Recurrent sinus infections: no ?ENT evaluation in past: yes ?Known environmental allergy: no ?Indoor pets:  two dogs ?History of asthma: no ?Current allergy medications: Zyrtec and Singulair -- has nasal spray ordered by ENT, not started yet. ?Treatments attempted: has tried multiple over the counter medications.  ? ?Relevant past medical, surgical, family and social history reviewed and updated as indicated. Interim medical history since our last visit reviewed. ?Allergies and medications reviewed and updated. ? ?Review of Systems  ?Constitutional:  Negative for activity change, appetite change, fatigue and fever.  ?HENT: Negative.    ?Respiratory:  Negative for cough, chest tightness, shortness of breath and wheezing.   ?Cardiovascular:  Negative for chest pain, palpitations and leg swelling.  ?Gastrointestinal: Negative.   ?Endocrine: Negative.   ?Neurological: Negative.   ?Psychiatric/Behavioral: Negative.    ? ?Per HPI unless specifically indicated above ? ?   ?Objective:  ?  ?BP 110/66   Pulse 72   Temp 98.2 ?F (36.8 ?C) (Oral)   Ht 5\' 4"   (1.626 m)   Wt 123 lb 3.2 oz (55.9 kg)   SpO2 98%   BMI 21.15 kg/m?   ?Wt Readings from Last 3 Encounters:  ?07/27/21 123 lb 3.2 oz (55.9 kg) (40 %, Z= -0.25)*  ?03/13/21 120 lb 9.6 oz (54.7 kg) (36 %, Z= -0.36)*  ?02/17/21 119 lb 3.2 oz (54.1 kg) (33 %, Z= -0.44)*  ? ?* Growth percentiles are based on CDC (Girls, 2-20 Years) data.  ?  ?Physical Exam ?Vitals and nursing note reviewed.  ?Constitutional:   ?   General: She is awake. She is not in acute distress. ?   Appearance: She is well-developed and well-groomed. She is not ill-appearing or toxic-appearing.  ?HENT:  ?   Head: Normocephalic.  ?   Right Ear: Hearing, tympanic membrane, ear canal and external ear normal. No middle ear effusion.  ?   Left Ear: Hearing, tympanic membrane, ear canal and external ear normal.  No middle ear effusion.  ?   Nose: No rhinorrhea.  ?   Right Sinus: No maxillary sinus tenderness or frontal sinus tenderness.  ?   Left Sinus: No maxillary sinus tenderness or frontal sinus tenderness.  ?   Mouth/Throat:  ?   Mouth: Mucous membranes are moist.  ?   Pharynx: Oropharynx is clear.  ?Eyes:  ?   General: Lids are normal.     ?   Right eye: No discharge.     ?   Left eye: No discharge.  ?  Conjunctiva/sclera: Conjunctivae normal.  ?   Pupils: Pupils are equal, round, and reactive to light.  ?Neck:  ?   Thyroid: No thyromegaly.  ?   Vascular: No carotid bruit.  ?Cardiovascular:  ?   Rate and Rhythm: Normal rate and regular rhythm.  ?   Heart sounds: Normal heart sounds. No murmur heard. ?  No gallop.  ?Pulmonary:  ?   Effort: Pulmonary effort is normal. No accessory muscle usage or respiratory distress.  ?   Breath sounds: Normal breath sounds.  ?Abdominal:  ?   General: Bowel sounds are normal.  ?   Palpations: Abdomen is soft.  ?Musculoskeletal:  ?   Cervical back: Normal range of motion and neck supple.  ?   Right lower leg: No edema.  ?   Left lower leg: No edema.  ?Lymphadenopathy:  ?   Cervical: No cervical adenopathy.  ?Skin: ?    General: Skin is warm and dry.  ?Neurological:  ?   Mental Status: She is alert and oriented to person, place, and time.  ?Psychiatric:     ?   Attention and Perception: Attention normal.     ?   Mood and Affect: Mood normal.     ?   Speech: Speech normal.     ?   Behavior: Behavior normal. Behavior is cooperative.     ?   Thought Content: Thought content normal.  ? ? ?Results for orders placed or performed during the hospital encounter of 03/08/18  ?Group A Strep by PCR  ? Specimen: Throat; Sterile Swab  ?Result Value Ref Range  ? Group A Strep by PCR NOT DETECTED NOT DETECTED  ?Basic metabolic panel  ?Result Value Ref Range  ? Sodium 137 135 - 145 mmol/L  ? Potassium 3.9 3.5 - 5.1 mmol/L  ? Chloride 103 98 - 111 mmol/L  ? CO2 23 22 - 32 mmol/L  ? Glucose, Bld 91 70 - 99 mg/dL  ? BUN 16 4 - 18 mg/dL  ? Creatinine, Ser 0.62 0.50 - 1.00 mg/dL  ? Calcium 9.3 8.9 - 10.3 mg/dL  ? GFR calc non Af Amer NOT CALCULATED >60 mL/min  ? GFR calc Af Amer NOT CALCULATED >60 mL/min  ? Anion gap 11 5 - 15  ?CBC with Differential  ?Result Value Ref Range  ? WBC 4.8 4.5 - 13.5 K/uL  ? RBC 4.40 3.80 - 5.70 MIL/uL  ? Hemoglobin 13.7 12.0 - 16.0 g/dL  ? HCT 39.4 36.0 - 49.0 %  ? MCV 89.5 78.0 - 98.0 fL  ? MCH 31.1 25.0 - 34.0 pg  ? MCHC 34.8 31.0 - 37.0 g/dL  ? RDW 11.9 11.4 - 15.5 %  ? Platelets 253 150 - 400 K/uL  ? nRBC 0.0 0.0 - 0.2 %  ? Neutrophils Relative % 55 %  ? Neutro Abs 2.6 1.7 - 8.0 K/uL  ? Lymphocytes Relative 33 %  ? Lymphs Abs 1.6 1.1 - 4.8 K/uL  ? Monocytes Relative 11 %  ? Monocytes Absolute 0.5 0.2 - 1.2 K/uL  ? Eosinophils Relative 1 %  ? Eosinophils Absolute 0.1 0.0 - 1.2 K/uL  ? Basophils Relative 0 %  ? Basophils Absolute 0.0 0.0 - 0.1 K/uL  ? Immature Granulocytes 0 %  ? Abs Immature Granulocytes 0.01 0.00 - 0.07 K/uL  ? ?   ?Assessment & Plan:  ? ?Problem List Items Addressed This Visit   ? ?  ? Respiratory  ? Allergic rhinitis - Primary  ?  Chronic, ongoing.  Continue current medication regimen and adjust as  needed.  Singulair is offering benefit.  Collaboration with ENT continues and is scheduled for allergy testing. ?  ?  ?  ? Other  ? Acute cough  ?  Improved at this time, continue current allergy medication regimen. ?  ?  ?  ? ?Follow up plan: ?Return in about 7 months (around 03/11/2022) for Annual physical. ? ? ? ? ? ?

## 2021-07-27 NOTE — Assessment & Plan Note (Signed)
Improved at this time, continue current allergy medication regimen. ?

## 2021-07-28 DIAGNOSIS — J301 Allergic rhinitis due to pollen: Secondary | ICD-10-CM | POA: Diagnosis not present

## 2021-08-04 DIAGNOSIS — L700 Acne vulgaris: Secondary | ICD-10-CM | POA: Diagnosis not present

## 2021-08-04 DIAGNOSIS — L74510 Primary focal hyperhidrosis, axilla: Secondary | ICD-10-CM | POA: Diagnosis not present

## 2021-08-04 DIAGNOSIS — L988 Other specified disorders of the skin and subcutaneous tissue: Secondary | ICD-10-CM | POA: Diagnosis not present

## 2021-08-11 ENCOUNTER — Telehealth: Payer: Self-pay | Admitting: Nurse Practitioner

## 2021-08-11 NOTE — Telephone Encounter (Signed)
Copied from CRM [id number]. Topic: General - Other ?>> Aug 11, 2021  1:07 PM Marylen Ponto wrote: ?Reason for CRM: Paula Brock with [employer] asked if the primary and secondary codes for the office visit on 2021/03/13 could be switched so insurance will cover. Per Paula Brock the service code and diagnosis code are mismatch and the primary code used is vague. Paula Brock requests if possible for the codes to be switched so insurance will cover the visit. Cb# [phone] Reference# [id number] ?

## 2021-08-20 DIAGNOSIS — J329 Chronic sinusitis, unspecified: Secondary | ICD-10-CM | POA: Diagnosis not present

## 2021-08-21 NOTE — Telephone Encounter (Signed)
LVM asking patient to call back.  ?OK for [facility] to disclose information to patient if she calls back. ?

## 2021-09-28 DIAGNOSIS — D225 Melanocytic nevi of trunk: Secondary | ICD-10-CM | POA: Diagnosis not present

## 2021-09-28 DIAGNOSIS — L720 Epidermal cyst: Secondary | ICD-10-CM | POA: Diagnosis not present

## 2021-10-29 ENCOUNTER — Encounter: Payer: Self-pay | Admitting: Emergency Medicine

## 2021-10-29 ENCOUNTER — Other Ambulatory Visit: Payer: Self-pay

## 2021-10-29 ENCOUNTER — Ambulatory Visit
Admission: EM | Admit: 2021-10-29 | Discharge: 2021-10-29 | Disposition: A | Discharge status: Home / Self Care | Payer: BC Managed Care – PPO | Attending: Emergency Medicine | Admitting: Emergency Medicine

## 2021-10-29 DIAGNOSIS — J069 Acute upper respiratory infection, unspecified: Secondary | ICD-10-CM | POA: Diagnosis not present

## 2021-10-29 DIAGNOSIS — Z20822 Contact with and (suspected) exposure to covid-19: Secondary | ICD-10-CM | POA: Diagnosis not present

## 2021-10-29 LAB — SARS CORONAVIRUS 2 BY RT PCR: SARS Coronavirus 2 by RT PCR: NEGATIVE

## 2021-10-29 LAB — GROUP A STREP BY PCR: Group A Strep by PCR: NOT DETECTED

## 2021-10-29 MED ORDER — IPRATROPIUM BROMIDE 0.06 % NA SOLN: 2.0000 | Freq: Four times a day (QID) | NASAL | 12 refills | Status: DC

## 2021-10-29 NOTE — ED Provider Notes (Signed)
[facility]    CSN: [id number] Arrival date & time: 10/29/21  0902      History   Chief Complaint Chief Complaint  Patient presents with   Otalgia   Sore Throat    HPI Paula Brock is a 21 y.o. female.   HPI  21 year old female here for evaluation of upper respiratory complaints.  Patient reports that she has been experiencing a sore throat for the last 3 days that developed into left ear pressure.  She denies any fever, runny nose nasal congestion, or cough.  She did have an episode of nausea followed by syncope.  She states that she has these frequently due to low blood pressure and she typically feels them coming on.  She did feel this was coming on so she laid down and did not suffer a fall.  No chest pain or head injury.  Patient states that they typically treat her low blood pressure and syncope with increased fluid intake as well as increased salt intake.  She does state that when she is ill her symptoms increase.  Past Medical History:  Diagnosis Date   ADHD (attention deficit hyperactivity disorder)    Endometriosis    Migraines    Reflux    occasionally   Wears contact lenses     Patient Active Problem List   Diagnosis Date Noted   Allergic rhinitis 07/27/2021   Acute cough 02/17/2021   Palpitations 02/16/2021   Pelvic pain 02/16/2021   Intractable migraine without aura and without status migrainosus 03/09/2017   History of ADHD 03/20/2014    Past Surgical History:  Procedure Laterality Date   KNEE ARTHROSCOPY Left 04/09/2015   Procedure: ARTHROSCOPY KNEE WITH DEBRIDEMENT with LATERAL RELEASE;  Surgeon: Christena Flake, MD;  Location: [facility];  Service: Orthopedics;  Laterality: Left;  Latex   NO PAST SURGERIES     WISDOM TOOTH EXTRACTION      OB History   No obstetric history on file.      Home Medications    Prior to Admission medications   Medication Sig Start Date End Date Taking? Authorizing Provider   amitriptyline (ELAVIL) 10 MG tablet Take by mouth. 01/03/20  Yes [provider]  ipratropium (ATROVENT) 0.06 % nasal spray Place 2 sprays into both nostrils 4 (four) times daily. 10/29/21  Yes Becky Augusta, NP  norethindrone-ethinyl estradiol (LOESTRIN) 1-20 MG-MCG tablet Take 1 tablet by mouth daily. 07/25/17  Yes [provider]  albuterol (VENTOLIN HFA) 108 (90 Base) MCG/ACT inhaler Inhale 2 puffs into the lungs every 6 (six) hours as needed for wheezing or shortness of breath. 02/17/21   Cannady, Corrie Dandy T, NP  glycopyrrolate (ROBINUL) 2 MG tablet Take 2 mg by mouth 2 (two) times daily. 01/30/21   [provider]  montelukast (SINGULAIR) 10 MG tablet Take 1 tablet (10 mg total) by mouth at bedtime. 07/27/21   Cannady, Jolene T, NP  NURTEC 75 MG TBDP Take 1 tablet by mouth daily as needed. 11/04/20   [provider]  ondansetron (ZOFRAN-ODT) 4 MG disintegrating tablet Take 4 mg by mouth every 8 (eight) hours as needed. 03/03/18   [provider]  SUMAtriptan (IMITREX) 100 MG tablet Take 50 mg by mouth daily as needed. 11/14/17   [provider]    Family History Family History  Problem Relation Age of Onset   Healthy Mother    Heart disease Father    Healthy Father    Cancer Maternal Grandfather  Diabetes Paternal Grandfather     Social History Social History   Tobacco Use   Smoking status: Never   Smokeless tobacco: Never  Vaping Use   Vaping Use: Never used  Substance Use Topics   Alcohol use: No    Alcohol/week: 0.0 standard drinks of alcohol   Drug use: Not Currently     Allergies   Latex   Review of Systems Review of Systems  Constitutional:  Negative for fever.  HENT:  Positive for ear pain and sore throat. Negative for congestion and rhinorrhea.   Respiratory:  Negative for cough, shortness of breath and wheezing.   Cardiovascular:  Negative for chest pain.  Gastrointestinal:  Positive for nausea. Negative for  vomiting.  Skin:  Negative for rash.  Neurological:  Positive for syncope.  Hematological: Negative.   Psychiatric/Behavioral: Negative.       Physical Exam Triage Vital Signs ED Triage Vitals  Enc Vitals Group     BP 10/29/21 0929 111/78     Pulse Rate 10/29/21 0929 96     Resp 10/29/21 0929 16     Temp 10/29/21 0929 98 F (36.7 C)     Temp Source 10/29/21 0929 Oral     SpO2 10/29/21 0929 100 %     Weight 10/29/21 0927 123 lb 3.8 oz (55.9 kg)     Height 10/29/21 0927 5\' 4"  (1.626 m)     Head Circumference --      Peak Flow --      Pain Score 10/29/21 0927 8     Pain Loc --      Pain Edu? --      Excl. in GC? --    No data found.  Updated Vital Signs BP 111/78 (BP Location: Left Arm)   Pulse 96   Temp 98 F (36.7 C) (Oral)   Resp 16   Ht 5\' 4"  (1.626 m)   Wt 123 lb 3.8 oz (55.9 kg)   SpO2 100%   BMI 21.15 kg/m   Visual Acuity Right Eye Distance:   Left Eye Distance:   Bilateral Distance:    Right Eye Near:   Left Eye Near:    Bilateral Near:     Physical Exam Vitals and nursing note reviewed.  Constitutional:      Appearance: Normal appearance. She is not ill-appearing.  HENT:     Head: Normocephalic and atraumatic.     Right Ear: Tympanic membrane, ear canal and external ear normal. There is no impacted cerumen.     Left Ear: Tympanic membrane, ear canal and external ear normal. There is no impacted cerumen.     Nose: Congestion and rhinorrhea present.     Mouth/Throat:     Mouth: Mucous membranes are moist.     Pharynx: Oropharynx is clear. Posterior oropharyngeal erythema present. No oropharyngeal exudate.  Cardiovascular:     Rate and Rhythm: Normal rate and regular rhythm.     Pulses: Normal pulses.     Heart sounds: Normal heart sounds. No murmur heard.    No friction rub. No gallop.  Pulmonary:     Effort: Pulmonary effort is normal.     Breath sounds: Normal breath sounds. No wheezing, rhonchi or rales.  Musculoskeletal:     Cervical  back: Normal range of motion and neck supple.  Lymphadenopathy:     Cervical: No cervical adenopathy.  Skin:    General: Skin is warm and dry.     Capillary Refill: Capillary refill  takes less than 2 seconds.     Findings: No erythema or rash.  Neurological:     General: No focal deficit present.     Mental Status: She is alert and oriented to person, place, and time.  Psychiatric:        Mood and Affect: Mood normal.        Behavior: Behavior normal.        Thought Content: Thought content normal.        Judgment: Judgment normal.      UC Treatments / Results  Labs (all labs ordered are listed, but only abnormal results are displayed) Labs Reviewed  GROUP A STREP BY PCR  SARS CORONAVIRUS 2 BY RT PCR    EKG   Radiology No results found.  Procedures Procedures (including critical care time)  Medications Ordered in UC Medications - No data to display  Initial Impression / Assessment and Plan / UC Course  I have reviewed the triage vital signs and the nursing notes.  Pertinent labs & imaging results that were available during my care of the patient were reviewed by me and considered in my medical decision making (see chart for details).  Patient is a pleasant, nontoxic-appearing 21 year old female here for evaluation of sore throat and left ear pain as outlined HPI above.  Her symptoms began 3 days ago and this was 1 day after returning from a cruise to the [place].  She states that her cruise departed out of [place].  She also indicates that multiple people on the cruise became ill with respiratory symptoms.  In addition to her respiratory symptoms she also suffered a syncopal episode this morning.  She laid down because she felt it coming on she reports that the episode lasted approximately 2 to 3 minutes.  She states that these are frequent occurrences for her secondary to low blood pressure which she manages with increased fluid intake and  increased salt intake.  Her episodes do increase when she is ill.  She denies any chest pain or shortness of breath.  Patient's physical exam reveals pearly-gray tympanic membranes bilaterally with normal light reflex and clear external auditory canals.  Nasal mucosa is erythematous and edematous with scant clear discharge in both nares.  Oropharyngeal exam reveals very mild posterior oropharyngeal erythema but no tonsillar edema, erythema, or exudate appreciated.  No cervical lymphadenopathy appreciated on exam.  Cardiopulmonary exam reveals S1-S2 heart sounds with regular rate and rhythm and lung sounds that are clear to auscultation all fields.  Strep PCR was collected at triage.  I will also order a COVID swab.  Strep PCR is negative.  COVID test is negative.  We will discharge patient home with a diagnosis of viral URI and treat her with Atrovent nasal spray and over-the-counter Tylenol and ibuprofen as needed for pain.  Return precautions reviewed.  Work note provided.   Final Clinical Impressions(s) / UC Diagnoses   Final diagnoses:  Upper respiratory tract infection, unspecified type     Discharge Instructions      Your testing today was negative for COVID and strep.  Your exam does reveal signs of an upper respiratory infection.  This is most likely viral.  Use the Atrovent nasal spray, 2 squirts up each nostril every 6 hours as needed for nasal congestion and postnasal drip.  Gargle with salt water to help alleviate sore throat pain.  Use over-the-counter Tylenol and ibuprofen according to package instructions as needed for pain.  Return for reevaluation for any new or worsening symptoms     ED Prescriptions     Medication Sig Dispense Auth. Provider   ipratropium (ATROVENT) 0.06 % nasal spray Place 2 sprays into both nostrils 4 (four) times daily. 15 mL Becky Augusta, NP      PDMP not reviewed this encounter.   Becky Augusta, NP 10/29/21 1022

## 2021-10-29 NOTE — Discharge Instructions (Signed)
Your testing today was negative for COVID and strep.  Your exam does reveal signs of an upper respiratory infection.  This is most likely viral.  Use the Atrovent nasal spray, 2 squirts up each nostril every 6 hours as needed for nasal congestion and postnasal drip.  Gargle with salt water to help alleviate sore throat pain.  Use over-the-counter Tylenol and ibuprofen according to package instructions as needed for pain.  Return for reevaluation for any new or worsening symptoms

## 2021-10-29 NOTE — ED Triage Notes (Signed)
Pt c/o sore throat, left ear pain. Started about 3 days ago. She states she passed out this morning but states she does this often because her BP is low.  Denies fever.

## 2021-11-17 DIAGNOSIS — N760 Acute vaginitis: Secondary | ICD-10-CM | POA: Diagnosis not present

## 2021-11-17 DIAGNOSIS — N898 Other specified noninflammatory disorders of vagina: Secondary | ICD-10-CM | POA: Diagnosis not present

## 2021-11-17 DIAGNOSIS — B9689 Other specified bacterial agents as the cause of diseases classified elsewhere: Secondary | ICD-10-CM | POA: Diagnosis not present

## 2022-02-01 DIAGNOSIS — Z01411 Encounter for gynecological examination (general) (routine) with abnormal findings: Secondary | ICD-10-CM | POA: Diagnosis not present

## 2022-02-01 DIAGNOSIS — N898 Other specified noninflammatory disorders of vagina: Secondary | ICD-10-CM | POA: Diagnosis not present

## 2022-02-01 DIAGNOSIS — Z113 Encounter for screening for infections with a predominantly sexual mode of transmission: Secondary | ICD-10-CM | POA: Diagnosis not present

## 2022-02-04 DIAGNOSIS — L74510 Primary focal hyperhidrosis, axilla: Secondary | ICD-10-CM | POA: Diagnosis not present

## 2022-02-04 DIAGNOSIS — L720 Epidermal cyst: Secondary | ICD-10-CM | POA: Diagnosis not present

## 2022-03-04 DIAGNOSIS — Z03818 Encounter for observation for suspected exposure to other biological agents ruled out: Secondary | ICD-10-CM | POA: Diagnosis not present

## 2022-03-04 DIAGNOSIS — J029 Acute pharyngitis, unspecified: Secondary | ICD-10-CM | POA: Diagnosis not present

## 2022-03-04 DIAGNOSIS — J039 Acute tonsillitis, unspecified: Secondary | ICD-10-CM | POA: Diagnosis not present

## 2022-03-07 NOTE — Patient Instructions (Incomplete)

## 2022-03-11 ENCOUNTER — Encounter: Payer: BC Managed Care – PPO | Admitting: Nurse Practitioner

## 2022-03-11 ENCOUNTER — Encounter: Payer: Self-pay | Admitting: Nurse Practitioner

## 2022-03-11 DIAGNOSIS — Z1159 Encounter for screening for other viral diseases: Secondary | ICD-10-CM

## 2022-03-11 DIAGNOSIS — Z13220 Encounter for screening for lipoid disorders: Secondary | ICD-10-CM

## 2022-03-11 DIAGNOSIS — G43019 Migraine without aura, intractable, without status migrainosus: Secondary | ICD-10-CM

## 2022-03-11 DIAGNOSIS — Z114 Encounter for screening for human immunodeficiency virus [HIV]: Secondary | ICD-10-CM

## 2022-03-11 DIAGNOSIS — Z23 Encounter for immunization: Secondary | ICD-10-CM

## 2022-03-11 DIAGNOSIS — Z0000 Encounter for general adult medical examination without abnormal findings: Secondary | ICD-10-CM

## 2022-03-11 DIAGNOSIS — J301 Allergic rhinitis due to pollen: Secondary | ICD-10-CM

## 2022-04-04 NOTE — Patient Instructions (Signed)

## 2022-04-09 ENCOUNTER — Encounter: Payer: Self-pay | Admitting: Nurse Practitioner

## 2022-04-09 ENCOUNTER — Ambulatory Visit (INDEPENDENT_AMBULATORY_CARE_PROVIDER_SITE_OTHER): Payer: BC Managed Care – PPO | Admitting: Nurse Practitioner

## 2022-04-09 VITALS — BP 112/75 | HR 82 | Temp 98.4°F | Ht 64.02 in | Wt 125.2 lb

## 2022-04-09 DIAGNOSIS — R002 Palpitations: Secondary | ICD-10-CM

## 2022-04-09 DIAGNOSIS — Z0000 Encounter for general adult medical examination without abnormal findings: Secondary | ICD-10-CM | POA: Diagnosis not present

## 2022-04-09 DIAGNOSIS — Z136 Encounter for screening for cardiovascular disorders: Secondary | ICD-10-CM

## 2022-04-09 DIAGNOSIS — G43019 Migraine without aura, intractable, without status migrainosus: Secondary | ICD-10-CM

## 2022-04-09 DIAGNOSIS — Z1159 Encounter for screening for other viral diseases: Secondary | ICD-10-CM

## 2022-04-09 DIAGNOSIS — Z23 Encounter for immunization: Secondary | ICD-10-CM

## 2022-04-09 DIAGNOSIS — Z114 Encounter for screening for human immunodeficiency virus [HIV]: Secondary | ICD-10-CM | POA: Diagnosis not present

## 2022-04-09 MED ORDER — AMITRIPTYLINE HCL 10 MG PO TABS: 10.0000 mg | ORAL_TABLET | Freq: Every day | ORAL | 4 refills | Status: DC

## 2022-04-09 NOTE — Assessment & Plan Note (Signed)
Chronic, ongoing.  Followed by neurology, will continue this collaboration and current medication regimen as prescribed by them.  Recent note reviewed.  Will send in refill for Amitriptyline and any other refills until she has return visit with neurology.

## 2022-04-09 NOTE — Progress Notes (Signed)
BP 112/75   Pulse 82   Temp 98.4 F (36.9 C) (Oral)   Ht 5' 4.02" (1.626 m)   Wt 125 lb 3.2 oz (56.8 kg)   SpO2 99%   BMI 21.48 kg/m    Subjective:    Patient ID: Paula Brock, female    DOB: [birth year], 21 y.o.   MRN: [id number]  HPI: Paula Brock is a 21 y.o. female presenting on 04/09/2022 for comprehensive medical examination. Current medical complaints include:none  She currently lives with: family Menopausal Symptoms: no  MIGRAINES Was followed by neurology, but they left practice - [facility].  Continues on Amitriptyline 10 MG daily -- takes Nurtec as needed and Zofran. Has a migraine every few months -- this is baseline and stable for her. Duration: chronic Onset: gradual Severity: 10/10 Quality: dull, aching, and throbbing Frequency: intermittent Location: neck and back of head Headache duration: 24 hours Radiation: no Time of day headache occurs: none Alleviating factors: as above medication Aggravating factors: flashing lights or strong smells Headache status at time of visit: asymptomatic Treatments attempted: Treatments attempted: Imitrex, Nurtec, Amitriptyline   Aura: yes Nausea:  yes Vomiting: yes Photophobia:  yes Phonophobia:  yes Effect on social functioning:  no Numbers of missed days of school/work each month: none Confusion:  no Gait disturbance/ataxia:  no Behavioral changes:  no Fevers:  no   Depression Screen done today and results listed below:     04/09/2022    1:17 PM 07/27/2021    2:49 PM 02/17/2021    8:47 AM  Depression screen PHQ 2/9  Decreased Interest 0 0 0  Down, Depressed, Hopeless 0 0 0  PHQ - 2 Score 0 0 0  Altered sleeping 1 0 0  Tired, decreased energy 1 0 0  Change in appetite 0 0 0  Feeling bad or failure about yourself  0 0 0  Trouble concentrating 0 0 0  Moving slowly or fidgety/restless 0 0 0  Suicidal thoughts 0 0 0  PHQ-9 Score 2 0 0  Difficult doing work/chores Not difficult at all  Not  difficult at all   The patient does not have a history of falls. I did not complete a risk assessment for falls. A plan of care for falls was not documented.   Past Medical History:  Past Medical History:  Diagnosis Date   ADHD (attention deficit hyperactivity disorder)    Endometriosis    Migraines    Reflux    occasionally   Wears contact lenses     Surgical History:  Past Surgical History:  Procedure Laterality Date   KNEE ARTHROSCOPY Left 04/09/2015   Procedure: ARTHROSCOPY KNEE WITH DEBRIDEMENT with LATERAL RELEASE;  Surgeon: Christena Flake, MD;  Location: [facility];  Service: Orthopedics;  Laterality: Left;  Latex   NO PAST SURGERIES     WISDOM TOOTH EXTRACTION      Medications:  Current Outpatient Medications on File Prior to Visit  Medication Sig   glycopyrrolate (ROBINUL) 2 MG tablet Take 2 mg by mouth 2 (two) times daily.   NURTEC 75 MG TBDP Take 1 tablet by mouth daily as needed.   ondansetron (ZOFRAN-ODT) 4 MG disintegrating tablet Take 4 mg by mouth every 8 (eight) hours as needed.   No current facility-administered medications on file prior to visit.    Allergies:  Allergies  Allergen Reactions   Latex Rash    After extended exposure    Social History:  Social History  Socioeconomic History   Marital status: Single    Spouse name: Not on file   Number of children: Not on file   Years of education: Not on file   Highest education level: Not on file  Occupational History   Occupation: real estate  Tobacco Use   Smoking status: Never   Smokeless tobacco: Never  Vaping Use   Vaping Use: Never used  Substance and Sexual Activity   Alcohol use: No    Alcohol/week: 0.0 standard drinks of alcohol   Drug use: Not Currently   Sexual activity: Not Currently  Other Topics Concern   Not on file  Social History Narrative   Not on file   Social Determinants of Health   Financial Resource Strain: Low Risk  (02/17/2021)   Overall Financial  Resource Strain (CARDIA)    Difficulty of Paying Living Expenses: Not very hard  Food Insecurity: No Food Insecurity (02/17/2021)   Hunger Vital Sign    Worried About Running Out of Food in the Last Year: Never true    Ran Out of Food in the Last Year: Never true  Transportation Needs: No Transportation Needs (02/17/2021)   PRAPARE - Administrator, Civil Service (Medical): No    Lack of Transportation (Non-Medical): No  Physical Activity: Sufficiently Active (02/17/2021)   Exercise Vital Sign    Days of Exercise per Week: 3 days    Minutes of Exercise per Session: 60 min  Stress: No Stress Concern Present (02/17/2021)   [employer] of Occupational Health - Occupational Stress Questionnaire    Feeling of Stress : Only a little  Social Connections: Moderately Isolated (02/17/2021)   Social Connection and Isolation Panel [NHANES]    Frequency of Communication with Friends and Family: More than three times a week    Frequency of Social Gatherings with Friends and Family: More than three times a week    Attends Religious Services: More than 4 times per year    Active Member of [employer] or Organizations: No    Attends Banker Meetings: Never    Marital Status: Never married  Intimate Partner Violence: Not At Risk (02/17/2021)   Humiliation, Afraid, Rape, and Kick questionnaire    Fear of Current or Ex-Partner: No    Emotionally Abused: No    Physically Abused: No    Sexually Abused: No   Social History   Tobacco Use  Smoking Status Never  Smokeless Tobacco Never   Social History   Substance and Sexual Activity  Alcohol Use No   Alcohol/week: 0.0 standard drinks of alcohol    Family History:  Family History  Problem Relation Age of Onset   Healthy Mother    Heart disease Father    Healthy Father    Cancer Maternal Grandfather    Diabetes Paternal Grandfather     Past medical history, surgical history, medications, allergies, family history  and social history reviewed with patient today and changes made to appropriate areas of the chart.   ROS All other ROS negative except what is listed above and in the HPI.      Objective:    BP 112/75   Pulse 82   Temp 98.4 F (36.9 C) (Oral)   Ht 5' 4.02" (1.626 m)   Wt 125 lb 3.2 oz (56.8 kg)   SpO2 99%   BMI 21.48 kg/m   Wt Readings from Last 3 Encounters:  04/09/22 125 lb 3.2 oz (56.8 kg)  10/29/21 123 lb  3.8 oz (55.9 kg)  07/27/21 123 lb 3.2 oz (55.9 kg) (40 %, Z= -0.25)*   * Growth percentiles are based on CDC (Girls, 2-20 Years) data.    Physical Exam Vitals and nursing note reviewed. Exam conducted with a chaperone present.  Constitutional:      General: She is awake. She is not in acute distress.    Appearance: She is well-developed and well-groomed. She is not ill-appearing or toxic-appearing.  HENT:     Head: Normocephalic and atraumatic.     Right Ear: Hearing, tympanic membrane, ear canal and external ear normal. No drainage.     Left Ear: Hearing, tympanic membrane, ear canal and external ear normal. No drainage.     Nose: Nose normal.     Right Sinus: No maxillary sinus tenderness or frontal sinus tenderness.     Left Sinus: No maxillary sinus tenderness or frontal sinus tenderness.     Mouth/Throat:     Mouth: Mucous membranes are moist.     Pharynx: Oropharynx is clear. Uvula midline. No pharyngeal swelling, oropharyngeal exudate or posterior oropharyngeal erythema.  Eyes:     General: Lids are normal.        Right eye: No discharge.        Left eye: No discharge.     Extraocular Movements: Extraocular movements intact.     Conjunctiva/sclera: Conjunctivae normal.     Pupils: Pupils are equal, round, and reactive to light.     Visual Fields: Right eye visual fields normal and left eye visual fields normal.  Neck:     Thyroid: No thyromegaly.     Vascular: No carotid bruit.     Trachea: Trachea normal.  Cardiovascular:     Rate and Rhythm: Normal  rate and regular rhythm.     Heart sounds: Normal heart sounds. No murmur heard.    No gallop.  Pulmonary:     Effort: Pulmonary effort is normal. No accessory muscle usage or respiratory distress.     Breath sounds: Normal breath sounds.  Chest:  Breasts:    Right: Normal.     Left: Normal.  Abdominal:     General: Bowel sounds are normal.     Palpations: Abdomen is soft. There is no hepatomegaly or splenomegaly.     Tenderness: There is no abdominal tenderness.  Musculoskeletal:        General: Normal range of motion.     Cervical back: Normal range of motion and neck supple.     Right lower leg: No edema.     Left lower leg: No edema.  Lymphadenopathy:     Head:     Right side of head: No submental, submandibular, tonsillar, preauricular or posterior auricular adenopathy.     Left side of head: No submental, submandibular, tonsillar, preauricular or posterior auricular adenopathy.     Cervical: No cervical adenopathy.     Upper Body:     Right upper body: No supraclavicular, axillary or pectoral adenopathy.     Left upper body: No supraclavicular, axillary or pectoral adenopathy.  Skin:    General: Skin is warm and dry.     Capillary Refill: Capillary refill takes less than 2 seconds.     Findings: No rash.  Neurological:     Mental Status: She is alert and oriented to person, place, and time.     Gait: Gait is intact.     Deep Tendon Reflexes: Reflexes are normal and symmetric.     Reflex  Scores:      Brachioradialis reflexes are 2+ on the right side and 2+ on the left side.      Patellar reflexes are 2+ on the right side and 2+ on the left side. Psychiatric:        Attention and Perception: Attention normal.        Mood and Affect: Mood normal.        Speech: Speech normal.        Behavior: Behavior normal. Behavior is cooperative.        Thought Content: Thought content normal.        Judgment: Judgment normal.     Results for orders placed or performed during  the hospital encounter of 10/29/21  Group A Strep by PCR   Specimen: Throat; Sterile Swab  Result Value Ref Range   Group A Strep by PCR NOT DETECTED NOT DETECTED  SARS Coronavirus 2 by RT PCR (hospital order, performed in [facility] hospital lab) *cepheid single result test* Anterior Nasal Swab   Specimen: Anterior Nasal Swab  Result Value Ref Range   SARS Coronavirus 2 by RT PCR NEGATIVE NEGATIVE      Assessment & Plan:   Problem List Items Addressed This Visit       Cardiovascular and Mediastinum   Intractable migraine without aura and without status migrainosus - Primary    Chronic, ongoing.  Followed by neurology, will continue this collaboration and current medication regimen as prescribed by them.  Recent note reviewed.  Will send in refill for Amitriptyline and any other refills until she has return visit with neurology.      Relevant Medications   amitriptyline (ELAVIL) 10 MG tablet   Other Relevant Orders   CBC with Differential/Platelet   TSH     Other   Palpitations    History of, stable today.  Obtain labs.      Relevant Orders   CBC with Differential/Platelet   Other Visit Diagnoses     Encounter for screening for cardiovascular disorders       Lipid panel obtained on labs today.   Relevant Orders   Comprehensive metabolic panel   Lipid Panel w/o Chol/HDL Ratio   Need for hepatitis C screening test       Hep C screen on labs today per guidelines for one time screening, discussed with patient.   Relevant Orders   Hepatitis C antibody   Encounter for screening for HIV       HIV screen on labs today per guidelines for one time screening, discussed with patient.   Relevant Orders   HIV Antibody (routine testing w rflx)   Encounter for annual physical exam       Annual physical today with labs and health maintenance reviewed, discussed with patient.        Follow up plan: Return in about 1 year (around 04/10/2023) for Annual physical.   LABORATORY  TESTING:  - Pap smear: not applicable  IMMUNIZATIONS:   - Tdap: Tetanus vaccination status reviewed: refused. - Influenza: Refused - Pneumovax: Not applicable - Prevnar: Not applicable - COVID: Refused - HPV: Up to date - Shingrix vaccine: Not applicable  SCREENING: -Mammogram: Not applicable  - Colonoscopy: Not applicable  - Bone Density: Not applicable  -Hearing Test: Not applicable  -Spirometry: Not applicable   PATIENT COUNSELING:   Advised to take 1 mg of folate supplement per day if capable of pregnancy.   Sexuality: Discussed sexually transmitted diseases, partner selection, use  of condoms, avoidance of unintended pregnancy  and contraceptive alternatives.   Advised to avoid cigarette smoking.  I discussed with the patient that most people either abstain from alcohol or drink within safe limits (<=14/week and <=4 drinks/occasion for males, <=7/weeks and <= 3 drinks/occasion for females) and that the risk for alcohol disorders and other health effects rises proportionally with the number of drinks per week and how often a drinker exceeds daily limits.  Discussed cessation/primary prevention of drug use and availability of treatment for abuse.   Diet: Encouraged to adjust caloric intake to maintain  or achieve ideal body weight, to reduce intake of dietary saturated fat and total fat, to limit sodium intake by avoiding high sodium foods and not adding table salt, and to maintain adequate dietary potassium and calcium preferably from fresh fruits, vegetables, and low-fat dairy products.    Stressed the importance of regular exercise  Injury prevention: Discussed safety belts, safety helmets, smoke detector, smoking near bedding or upholstery.   Dental health: Discussed importance of regular tooth brushing, flossing, and dental visits.    NEXT PREVENTATIVE PHYSICAL DUE IN 1 YEAR. Return in about 1 year (around 04/10/2023) for Annual physical.

## 2022-04-09 NOTE — Assessment & Plan Note (Signed)
History of, stable today.  Obtain labs.

## 2022-04-10 ENCOUNTER — Encounter: Payer: Self-pay | Admitting: Nurse Practitioner

## 2022-04-10 LAB — COMPREHENSIVE METABOLIC PANEL
ALT: 83 IU/L — ABNORMAL HIGH (ref 0–32)
AST: 45 IU/L — ABNORMAL HIGH (ref 0–40)
Albumin/Globulin Ratio: 1.5 (ref 1.2–2.2)
Albumin: 4.1 g/dL (ref 4.0–5.0)
Alkaline Phosphatase: 64 IU/L (ref 42–106)
BUN/Creatinine Ratio: 17 (ref 9–23)
BUN: 11 mg/dL (ref 6–20)
Bilirubin Total: 0.4 mg/dL (ref 0.0–1.2)
CO2: 21 mmol/L (ref 20–29)
Calcium: 9.4 mg/dL (ref 8.7–10.2)
Chloride: 106 mmol/L (ref 96–106)
Creatinine, Ser: 0.65 mg/dL (ref 0.57–1.00)
Globulin, Total: 2.7 g/dL (ref 1.5–4.5)
Glucose: 93 mg/dL (ref 70–99)
Potassium: 4 mmol/L (ref 3.5–5.2)
Sodium: 142 mmol/L (ref 134–144)
Total Protein: 6.8 g/dL (ref 6.0–8.5)
eGFR: 129 mL/min/{1.73_m2} (ref >59)

## 2022-04-10 LAB — LIPID PANEL W/O CHOL/HDL RATIO
Cholesterol, Total: 183 mg/dL (ref 100–199)
HDL: 62 mg/dL (ref >39)
LDL Chol Calc (NIH): 113 mg/dL — ABNORMAL HIGH (ref 0–99)
Triglycerides: 40 mg/dL (ref 0–149)
VLDL Cholesterol Cal: 8 mg/dL (ref 5–40)

## 2022-04-10 LAB — CBC WITH DIFFERENTIAL/PLATELET
Basophils Absolute: 0 10*3/uL (ref 0.0–0.2)
Basos: 0 %
EOS (ABSOLUTE): 0 10*3/uL (ref 0.0–0.4)
Eos: 1 %
Hematocrit: 39 % (ref 34.0–46.6)
Hemoglobin: 13.1 g/dL (ref 11.1–15.9)
Immature Grans (Abs): 0 10*3/uL (ref 0.0–0.1)
Immature Granulocytes: 0 %
Lymphocytes Absolute: 2.1 10*3/uL (ref 0.7–3.1)
Lymphs: 41 %
MCH: 31.1 pg (ref 26.6–33.0)
MCHC: 33.6 g/dL (ref 31.5–35.7)
MCV: 93 fL (ref 79–97)
Monocytes Absolute: 0.6 10*3/uL (ref 0.1–0.9)
Monocytes: 11 %
Neutrophils Absolute: 2.5 10*3/uL (ref 1.4–7.0)
Neutrophils: 47 %
Platelets: 253 10*3/uL (ref 150–450)
RBC: 4.21 x10E6/uL (ref 3.77–5.28)
RDW: 12.3 % (ref 11.7–15.4)
WBC: 5.3 10*3/uL (ref 3.4–10.8)

## 2022-04-10 LAB — HIV ANTIBODY (ROUTINE TESTING W REFLEX): HIV Screen 4th Generation wRfx: NONREACTIVE

## 2022-04-10 LAB — TSH: TSH: 1.02 u[IU]/mL (ref 0.450–4.500)

## 2022-04-10 LAB — HEPATITIS C ANTIBODY: Hep C Virus Ab: NONREACTIVE

## 2022-04-10 NOTE — Progress Notes (Signed)
Contacted via MyChart   Good morning Paula Brock, your labs have returned: - Kidney function, creatinine and eGFR, remains normal.  Liver function, AST and ALT, showing some mild elevation -- do you drink any alcohol or take Tylenol?  Please let me know and cut back on these if used.  Would like to recheck this on outpatient labs in 8 weeks.  My staff will call to schedule with you for lab only visit, avoid Tylenol or alcohol intake 72 hours prior to visit. - Your LDL is above normal. The LDL is the bad cholesterol. Over time and in combination with inflammation and other factors, this contributes to plaque which in turn may lead to stroke and/or heart attack down the road. Sometimes high LDL is primarily genetic, and people might be eating all the right foods but still have high numbers. Other times, there is room for improvement in one's diet and eating healthier can bring this number down and potentially reduce one's risk of heart attack and/or stroke.   To reduce your LDL, Remember - more fruits and vegetables, more fish, and limit red meat and dairy products. More soy, nuts, beans, barley, lentils, oats and plant sterol ester enriched margarine instead of butter. I also encourage eliminating sugar and processed food. Remember, shop on the outside of the grocery store and visit your [employer]. If you would like to talk with me about dietary changes for your cholesterol, please let me know. We should recheck your cholesterol in 12 months.  Any questions?  Remainder of labs are all normal.  Have a great day!! Keep being amazing!!  Thank you for allowing me to participate in your care.  I appreciate you. Kindest regards, Alaiya Martindelcampo

## 2022-04-13 ENCOUNTER — Other Ambulatory Visit: Payer: Self-pay | Admitting: Nurse Practitioner

## 2022-04-13 NOTE — Telephone Encounter (Signed)
Requested medication (s) are due for refill today - expired Rx  Requested medication (s) are on the active medication list -yes  Future visit scheduled -yes  Last refill: 11/04/20- listed as historical   Notes to clinic: medication not assigned protocol- provider review   Requested Prescriptions  Pending Prescriptions Disp Refills   NURTEC 75 MG TBDP [Pharmacy Med Name: NURTEC 75 MG ODT] 8 tablet     Sig: TAKE 1 TABLET ONCE DAILY AS NEEDED     Off-Protocol Failed - 04/13/2022  8:29 AM      Failed - Medication not assigned to a protocol, review manually.      Passed - Valid encounter within last 12 months    Recent Outpatient Visits           4 days ago Intractable migraine without aura and without status migrainosus   [facility] [place], Newton T, NP   8 months ago Seasonal allergic rhinitis due to pollen   [facility], Dorie Rank, NP   1 year ago Acute cough   [facility] [place], [place] T, NP   1 year ago Acute cough   [facility] [place], Dorie Rank, NP       Future Appointments             In 12 months Cannady, Dorie Rank, NP [facility]               Requested Prescriptions  Pending Prescriptions Disp Refills   NURTEC 75 MG TBDP [Pharmacy Med Name: NURTEC 75 MG ODT] 8 tablet     Sig: TAKE 1 TABLET ONCE DAILY AS NEEDED     Off-Protocol Failed - 04/13/2022  8:29 AM      Failed - Medication not assigned to a protocol, review manually.      Passed - Valid encounter within last 12 months    Recent Outpatient Visits           4 days ago Intractable migraine without aura and without status migrainosus   [facility] [place], Jolene T, NP   8 months ago Seasonal allergic rhinitis due to pollen   [facility], Dorie Rank, NP   1 year ago Acute cough   [facility] [place], [place] T, NP   1 year ago Acute cough   [facility]  [place], Dorie Rank, NP       Future Appointments             In 12 months Cannady, Dorie Rank, NP [facility]

## 2022-07-12 ENCOUNTER — Encounter: Payer: Self-pay | Admitting: Nurse Practitioner

## 2022-07-27 ENCOUNTER — Telehealth: Payer: Self-pay | Admitting: Nurse Practitioner

## 2022-07-27 ENCOUNTER — Other Ambulatory Visit: Payer: Self-pay | Admitting: Nurse Practitioner

## 2022-07-27 DIAGNOSIS — R7989 Other specified abnormal findings of blood chemistry: Secondary | ICD-10-CM

## 2022-07-27 NOTE — Telephone Encounter (Signed)
Patient has lab appt for Thursday 4/4 at 8:40a

## 2022-07-27 NOTE — Telephone Encounter (Signed)
Copied from [place] [id number]. Topic: Appointment Scheduling - Scheduling Inquiry for Clinic >> Jul 27, 2022 11:26 AM Sabas Sous wrote: Reason for CRM: Pt wants to redo her labs from December prior to her appt 08/11/2022. Please advise

## 2022-07-29 ENCOUNTER — Other Ambulatory Visit: Payer: BC Managed Care – PPO

## 2022-07-29 DIAGNOSIS — R7989 Other specified abnormal findings of blood chemistry: Secondary | ICD-10-CM | POA: Diagnosis not present

## 2022-07-30 LAB — COMPREHENSIVE METABOLIC PANEL
ALT: 25 IU/L (ref 0–32)
AST: 20 IU/L (ref 0–40)
Albumin/Globulin Ratio: 1.6 (ref 1.2–2.2)
Albumin: 4.2 g/dL (ref 4.0–5.0)
Alkaline Phosphatase: 60 IU/L (ref 42–106)
BUN/Creatinine Ratio: 18 (ref 9–23)
BUN: 12 mg/dL (ref 6–20)
Bilirubin Total: 0.5 mg/dL (ref 0.0–1.2)
CO2: 20 mmol/L (ref 20–29)
Calcium: 9 mg/dL (ref 8.7–10.2)
Chloride: 104 mmol/L (ref 96–106)
Creatinine, Ser: 0.66 mg/dL (ref 0.57–1.00)
Globulin, Total: 2.7 g/dL (ref 1.5–4.5)
Glucose: 86 mg/dL (ref 70–99)
Potassium: 4.2 mmol/L (ref 3.5–5.2)
Sodium: 139 mmol/L (ref 134–144)
Total Protein: 6.9 g/dL (ref 6.0–8.5)
eGFR: 129 mL/min/{1.73_m2} (ref >59)

## 2022-07-30 NOTE — Progress Notes (Signed)
Contacted via MyChart   Good morning Paula Brock, liver function much improved this check and in normal range.  Recommend to reduce any alcohol use as suspect this may have caused recent increase in levels.  Any questions? Keep being amazing!!  Thank you for allowing me to participate in your care.  I appreciate you. Kindest regards, George Haggart

## 2022-08-07 NOTE — Patient Instructions (Signed)
Migraine Headache A migraine headache is a very strong throbbing pain on one or both sides of your head. This type of headache can also cause other symptoms. It can last from 4 hours to 3 days. Talk with your doctor about what things may bring on (trigger) this condition. What are the causes? The exact cause of a migraine is not known. This condition may be brought on or caused by: Smoking. Medicines, such as: Medicine used to treat chest pain (nitroglycerin). Birth control pills. Estrogen. Some blood pressure medicines. Certain substances in some foods or drinks. Foods and drinks, such as: Cheese. Chocolate. Alcohol. Caffeine. Doing physical activity that is very hard. Other things that may trigger a migraine headache include: Periods. Pregnancy. Hunger. Stress. Getting too much or too little sleep. Weather changes. Feeling tired (fatigue). What increases the risk? Being 25-55 years old. Being female. Having a family history of migraine headaches. Being Caucasian. Having a mental health condition, such as being sad (depressed) or feeling worried or nervous (anxious). Being very overweight (obese). What are the signs or symptoms? A throbbing pain. This pain may: Happen in any area of the head, such as on one or both sides. Make it hard to do daily activities. Get worse with physical activity. Get worse around bright lights, loud noises, or smells. Other symptoms may include: Feeling like you may vomit (nauseous). Vomiting. Dizziness. Before a migraine headache starts, you may get warning signs (an aura). An aura may include: Seeing flashing lights or having blind spots. Seeing bright spots, halos, or zigzag lines. Having tunnel vision or blurred vision. Having numbness or a tingling feeling. Having trouble talking. Having weak muscles. After a migraine ends, you may have symptoms. These may include: Tiredness. Trouble thinking (concentrating). How is this  treated? Taking medicines that: Relieve pain. Relieve the feeling like you may vomit. Prevent migraine headaches. Treatment may also include: Acupuncture. Lifestyle changes like avoiding foods that bring on migraine headaches. Learning ways to control your body functions (biofeedback). Therapy to help you know and deal with negative thoughts (cognitive behavioral therapy). Follow these instructions at home: Medicines Take over-the-counter and prescription medicines only as told by your doctor. If told, take steps to prevent problems with pooping (constipation). You may need to: Drink enough fluid to keep your pee (urine) pale yellow. Take medicines. You will be told what medicines to take. Eat foods that are high in fiber. These include beans, whole grains, and fresh fruits and vegetables. Limit foods that are high in fat and sugar. These include fried or sweet foods. Ask your doctor if you should avoid driving or using machines while you are taking your medicine. Lifestyle  Do not drink alcohol. Do not smoke or use any products that contain nicotine or tobacco. If you need help quitting, ask your doctor. Get 7-9 hours of sleep each night, or the amount recommended by your doctor. Find ways to deal with stress, such as meditation, deep breathing, or yoga. Try to exercise often. This can help lessen how bad and how often your migraines happen. General instructions Keep a journal to find out what may bring on your migraine headaches. This can help you avoid those things. For example, write down: What you eat and drink. How much sleep you get. Any change to your medicines or diet. If you have a migraine headache: Avoid things that make your symptoms worse, such as bright lights. Lie down in a dark, quiet room. Do not drive or use machinery. Ask your   doctor what activities are safe for you. Where to find more information Coalition for Headache and Migraine Patients (CHAMP):  headachemigraine.org American Migraine Foundation: americanmigrainefoundation.org National Headache Foundation: headaches.org Contact a doctor if: You get a migraine headache that is different or worse than others you have had. You have more than 15 days of headaches in one month. Get help right away if: Your migraine headache gets very bad. Your migraine headache lasts more than 72 hours. You have a fever or stiff neck. You have trouble seeing. Your muscles feel weak or like you cannot control them. You lose your balance a lot. You have trouble walking. You faint. You have a seizure. This information is not intended to replace advice given to you by your health care provider. Make sure you discuss any questions you have with your health care provider. Document Revised: 12/07/2021 Document Reviewed: 12/07/2021 Elsevier Patient Education  2023 Elsevier Inc.  

## 2022-08-11 ENCOUNTER — Ambulatory Visit (INDEPENDENT_AMBULATORY_CARE_PROVIDER_SITE_OTHER): Payer: BC Managed Care – PPO | Admitting: Nurse Practitioner

## 2022-08-11 ENCOUNTER — Encounter: Payer: Self-pay | Admitting: Nurse Practitioner

## 2022-08-11 VITALS — BP 118/80 | HR 81 | Temp 98.1°F | Ht 64.02 in | Wt 113.3 lb

## 2022-08-11 DIAGNOSIS — J020 Streptococcal pharyngitis: Secondary | ICD-10-CM | POA: Insufficient documentation

## 2022-08-11 DIAGNOSIS — J029 Acute pharyngitis, unspecified: Secondary | ICD-10-CM | POA: Diagnosis not present

## 2022-08-11 LAB — RAPID STREP SCREEN (MED CTR MEBANE ONLY): Strep Gp A Ag, IA W/Reflex: POSITIVE — AB

## 2022-08-11 MED ORDER — PENICILLIN V POTASSIUM 500 MG PO TABS: 500.0000 mg | ORAL_TABLET | Freq: Three times a day (TID) | ORAL | 0 refills | Status: AC

## 2022-08-11 NOTE — Assessment & Plan Note (Addendum)
Recurrent episodes, about 5 to 7, since November.  Treated x 1 per patient report.  Strep swab positive today.  Will send for culture.  Start Pen V 500 MG TID for 10 days.  Referral to ENT, Dr. Jenne Campus, per request for further assessment and recommendations.

## 2022-08-11 NOTE — Progress Notes (Signed)
BP 118/80   Pulse 81   Temp 98.1 F (36.7 C) (Oral)   Ht 5' 4.02" (1.626 m)   Wt 113 lb 4.8 oz (51.4 kg)   LMP 07/10/2022   SpO2 99%   BMI 19.44 kg/m    Subjective:    Patient ID: Paula Brock, female    DOB: Dec 21, 2000, 22 y.o.   MRN: [id number]  HPI: Paula Brock is a 22 y.o. female  Chief Complaint  Patient presents with   Review Labs   Tonsils   Reviewed recent liver function testing with patient -- had elevation in December at physical (had gone to Christmas party night before and had some alcohol intake, mild) -- repeat labs were normal.  Discussed with her today.  TONSIL ISSUES Has had issues with tonsils since early November, had tonsillitis at the time and was treated.  Since that time has been having more frequent episodes of throat discomfort.  Had fever on occasion with these episodes.  She has had 5-7 episodes since November -- sometimes last a few days and other times over a week.  Drinks Nettle tea for allergies in the spring.  Currently has sore throat to left side that started yesterday.    She did have strep throat a lot as a child.  Has never had any tonsil surgeries. Cough: no Shortness of breath: no Wheezing: no Chest pain: no Chest tightness: no Chest congestion: no Nasal congestion: no Runny nose: no Post nasal drip: no Sneezing: no Sore throat: yes to left side Swollen glands: no Sinus pressure: no Headache: no Face pain: no Toothache: no Ear pain: none Ear pressure: none Eyes red/itching:no Eye drainage/crusting: no  Vomiting: no Rash: no Fatigue: no  Relevant past medical, surgical, family and social history reviewed and updated as indicated. Interim medical history since our last visit reviewed. Allergies and medications reviewed and updated.  Review of Systems  Constitutional:  Negative for activity change, appetite change, diaphoresis, fatigue and fever.  HENT:  Positive for sore throat. Negative for congestion,  ear discharge, ear pain, postnasal drip, rhinorrhea, sinus pressure, sinus pain and trouble swallowing.   Respiratory:  Negative for cough, chest tightness, shortness of breath and wheezing.   Cardiovascular:  Negative for chest pain, palpitations and leg swelling.  Gastrointestinal: Negative.   Neurological: Negative.   Psychiatric/Behavioral: Negative.      Per HPI unless specifically indicated above     Objective:    BP 118/80   Pulse 81   Temp 98.1 F (36.7 C) (Oral)   Ht 5' 4.02" (1.626 m)   Wt 113 lb 4.8 oz (51.4 kg)   LMP 07/10/2022   SpO2 99%   BMI 19.44 kg/m   Wt Readings from Last 3 Encounters:  08/11/22 113 lb 4.8 oz (51.4 kg)  04/09/22 125 lb 3.2 oz (56.8 kg)  10/29/21 123 lb 3.8 oz (55.9 kg)    Physical Exam Vitals and nursing note reviewed.  Constitutional:      General: She is awake. She is not in acute distress.    Appearance: She is well-developed and well-groomed. She is not ill-appearing or toxic-appearing.  HENT:     Head: Normocephalic.     Right Ear: Hearing and external ear normal.     Left Ear: Hearing and external ear normal.     Nose: Nose normal.     Right Sinus: No maxillary sinus tenderness or frontal sinus tenderness.     Left Sinus: No maxillary sinus  tenderness or frontal sinus tenderness.     Mouth/Throat:     Mouth: Mucous membranes are moist.     Pharynx: Posterior oropharyngeal erythema (mild noted) present. No pharyngeal swelling or oropharyngeal exudate.     Tonsils: No tonsillar exudate or tonsillar abscesses. 2+ on the right. 2+ on the left.  Eyes:     General: Lids are normal.        Right eye: No discharge.        Left eye: No discharge.     Conjunctiva/sclera: Conjunctivae normal.     Pupils: Pupils are equal, round, and reactive to light.  Neck:     Thyroid: No thyromegaly.     Vascular: No carotid bruit or JVD.  Cardiovascular:     Rate and Rhythm: Normal rate and regular rhythm.     Heart sounds: Normal heart sounds.  No murmur heard.    No gallop.  Pulmonary:     Effort: Pulmonary effort is normal.     Breath sounds: Normal breath sounds.  Abdominal:     General: Bowel sounds are normal. There is no distension.     Palpations: Abdomen is soft.     Tenderness: There is no abdominal tenderness.  Musculoskeletal:     Cervical back: Normal range of motion and neck supple.     Right lower leg: No edema.     Left lower leg: No edema.  Lymphadenopathy:     Cervical: No cervical adenopathy.  Skin:    General: Skin is warm and dry.  Neurological:     Mental Status: She is alert and oriented to person, place, and time.  Psychiatric:        Attention and Perception: Attention normal.        Mood and Affect: Mood normal.        Speech: Speech normal.        Behavior: Behavior normal. Behavior is cooperative.        Thought Content: Thought content normal.     Results for orders placed or performed in visit on 07/29/22  Comprehensive metabolic panel  Result Value Ref Range   Glucose 86 70 - 99 mg/dL   BUN 12 6 - 20 mg/dL   Creatinine, Ser 1.61 0.57 - 1.00 mg/dL   eGFR 096 >04 VW/UJW/1.19   BUN/Creatinine Ratio 18 9 - 23   Sodium 139 134 - 144 mmol/L   Potassium 4.2 3.5 - 5.2 mmol/L   Chloride 104 96 - 106 mmol/L   CO2 20 20 - 29 mmol/L   Calcium 9.0 8.7 - 10.2 mg/dL   Total Protein 6.9 6.0 - 8.5 g/dL   Albumin 4.2 4.0 - 5.0 g/dL   Globulin, Total 2.7 1.5 - 4.5 g/dL   Albumin/Globulin Ratio 1.6 1.2 - 2.2   Bilirubin Total 0.5 0.0 - 1.2 mg/dL   Alkaline Phosphatase 60 42 - 106 IU/L   AST 20 0 - 40 IU/L   ALT 25 0 - 32 IU/L      Assessment & Plan:   Problem List Items Addressed This Visit       Respiratory   Strep throat - Primary    Recurrent episodes, about 5 to 7, since November.  Treated x 1 per patient report.  Strep swab positive today.  Will send for culture.  Start Pen V 500 MG TID for 10 days.  Referral to ENT, Dr. Jenne Campus, per request for further assessment and  recommendations.  Relevant Orders   Rapid Strep Screen ([facility] ONLY)   Ambulatory referral to ENT     Follow up plan: Return if symptoms worsen or fail to improve.

## 2022-08-20 ENCOUNTER — Telehealth: Payer: Self-pay

## 2022-08-20 NOTE — Telephone Encounter (Signed)
PA started for Nurtec 75mg through [employer]. Awaiting on determination  

## 2022-08-30 ENCOUNTER — Telehealth: Payer: Self-pay

## 2022-08-30 NOTE — Telephone Encounter (Signed)
PA for Nurtec ODT 75mg has been approved 

## 2022-09-03 DIAGNOSIS — Z13220 Encounter for screening for lipoid disorders: Secondary | ICD-10-CM | POA: Diagnosis not present

## 2022-09-03 DIAGNOSIS — N939 Abnormal uterine and vaginal bleeding, unspecified: Secondary | ICD-10-CM | POA: Diagnosis not present

## 2022-09-03 DIAGNOSIS — Z131 Encounter for screening for diabetes mellitus: Secondary | ICD-10-CM | POA: Diagnosis not present

## 2022-09-06 DIAGNOSIS — J3501 Chronic tonsillitis: Secondary | ICD-10-CM | POA: Diagnosis not present

## 2022-09-21 DIAGNOSIS — Z91040 Latex allergy status: Secondary | ICD-10-CM | POA: Diagnosis not present

## 2022-09-21 DIAGNOSIS — R519 Headache, unspecified: Secondary | ICD-10-CM | POA: Diagnosis not present

## 2022-09-21 DIAGNOSIS — G43909 Migraine, unspecified, not intractable, without status migrainosus: Secondary | ICD-10-CM | POA: Diagnosis not present

## 2022-09-27 ENCOUNTER — Ambulatory Visit (INDEPENDENT_AMBULATORY_CARE_PROVIDER_SITE_OTHER): Payer: BC Managed Care – PPO | Admitting: Nurse Practitioner

## 2022-09-27 ENCOUNTER — Encounter: Payer: Self-pay | Admitting: Nurse Practitioner

## 2022-09-27 VITALS — BP 108/73 | HR 59 | Temp 98.0°F | Ht 64.02 in | Wt 114.0 lb

## 2022-09-27 DIAGNOSIS — J351 Hypertrophy of tonsils: Secondary | ICD-10-CM

## 2022-09-27 LAB — RAPID STREP SCREEN (MED CTR MEBANE ONLY): Strep Gp A Ag, IA W/Reflex: NEGATIVE

## 2022-09-27 NOTE — Progress Notes (Signed)
Results discussed with patient during visit.

## 2022-09-27 NOTE — Progress Notes (Signed)
BP 108/73   Pulse (!) 59   Temp 98 F (36.7 C) (Oral)   Ht 5' 4.02" (1.626 m)   Wt 114 lb (51.7 kg)   SpO2 100%   BMI 19.56 kg/m    Subjective:    Patient ID: Paula Brock, female    DOB: 2000/06/19, 22 y.o.   MRN: [id number]  HPI: Paula Brock is a 22 y.o. female  Chief Complaint  Patient presents with   Swollen Tonsils    Started today, and also a scratchy throat   UPPER RESPIRATORY TRACT INFECTION Worst symptom: symptoms started today Fever: no Cough: yes Shortness of breath: no Wheezing: no Chest pain: yes, with cough Chest tightness: no Chest congestion: no Nasal congestion: yes Runny nose: no Post nasal drip: no Sneezing: no Sore throat: yes Swollen glands: yes Sinus pressure: no Headache: no Face pain: no Toothache: no Ear pain: no bilateral Ear pressure: no bilateral Eyes red/itching:no Eye drainage/crusting: no  Vomiting: no Rash: no Fatigue: no Sick contacts: no Strep contacts: no  Context: stable Recurrent sinusitis: no Relief with OTC cold/cough medications: no  Treatments attempted: none   Relevant past medical, surgical, family and social history reviewed and updated as indicated. Interim medical history since our last visit reviewed. Allergies and medications reviewed and updated.  Review of Systems  Constitutional:  Negative for fatigue and fever.  HENT:  Positive for congestion and sore throat. Negative for dental problem, ear pain, postnasal drip, rhinorrhea, sinus pressure, sinus pain and sneezing.   Respiratory:  Positive for cough. Negative for shortness of breath and wheezing.   Cardiovascular:  Negative for chest pain.  Gastrointestinal:  Negative for vomiting.  Skin:  Negative for rash.  Neurological:  Negative for headaches.    Per HPI unless specifically indicated above     Objective:    BP 108/73   Pulse (!) 59   Temp 98 F (36.7 C) (Oral)   Ht 5' 4.02" (1.626 m)   Wt 114 lb (51.7 kg)   SpO2 100%    BMI 19.56 kg/m   Wt Readings from Last 3 Encounters:  09/27/22 114 lb (51.7 kg)  08/11/22 113 lb 4.8 oz (51.4 kg)  04/09/22 125 lb 3.2 oz (56.8 kg)    Physical Exam Vitals and nursing note reviewed.  Constitutional:      General: She is not in acute distress.    Appearance: Normal appearance. She is normal weight. She is not ill-appearing, toxic-appearing or diaphoretic.  HENT:     Head: Normocephalic.     Right Ear: External ear normal. A middle ear effusion is present.     Left Ear: External ear normal. A middle ear effusion is present.     Nose: Nose normal.     Mouth/Throat:     Mouth: Mucous membranes are moist.     Pharynx: Posterior oropharyngeal erythema present. No pharyngeal swelling.     Tonsils: No tonsillar exudate or tonsillar abscesses. 1+ on the right. 1+ on the left.  Eyes:     General:        Right eye: No discharge.        Left eye: No discharge.     Extraocular Movements: Extraocular movements intact.     Conjunctiva/sclera: Conjunctivae normal.     Pupils: Pupils are equal, round, and reactive to light.  Cardiovascular:     Rate and Rhythm: Normal rate and regular rhythm.     Heart sounds: No murmur heard. Pulmonary:  Effort: Pulmonary effort is normal. No respiratory distress.     Breath sounds: Normal breath sounds. No wheezing or rales.  Musculoskeletal:     Cervical back: Normal range of motion and neck supple.  Lymphadenopathy:     Cervical: Cervical adenopathy present.     Right cervical: Superficial cervical adenopathy present.     Left cervical: Superficial cervical adenopathy present.  Skin:    General: Skin is warm and dry.     Capillary Refill: Capillary refill takes less than 2 seconds.  Neurological:     General: No focal deficit present.     Mental Status: She is alert and oriented to person, place, and time. Mental status is at baseline.  Psychiatric:        Mood and Affect: Mood normal.        Behavior: Behavior normal.         Thought Content: Thought content normal.        Judgment: Judgment normal.     Results for orders placed or performed in visit on 08/11/22  Rapid Strep Screen ([facility] ONLY)   Specimen: Other   Other  Result Value Ref Range   Strep Gp A Ag, IA W/Reflex Positive (A) Negative      Assessment & Plan:   Problem List Items Addressed This Visit   None Visit Diagnoses     Swollen tonsil    -  Primary   Likely viral in nature.  Strep neg in office. Recommend OTC symptom management.  Follow up if symptoms not improved.   Relevant Orders   Rapid Strep screen([facility])        Follow up plan: No follow-ups on file.

## 2022-09-29 DIAGNOSIS — N939 Abnormal uterine and vaginal bleeding, unspecified: Secondary | ICD-10-CM | POA: Diagnosis not present

## 2022-09-29 LAB — CULTURE, GROUP A STREP: Strep A Culture: POSITIVE — AB

## 2022-09-29 MED ORDER — AMOXICILLIN 500 MG PO CAPS: 500.0000 mg | ORAL_CAPSULE | Freq: Two times a day (BID) | ORAL | 0 refills | Status: AC

## 2022-09-29 NOTE — Addendum Note (Signed)
Addended by: Larae Grooms on: 09/29/2022 07:55 AM   Modules accepted: Orders

## 2022-09-29 NOTE — Progress Notes (Signed)
Please let patient know that her strep culture came back positive.  I have sent in amoxicillin to twice daily for 10 days to treat this.

## 2022-10-20 ENCOUNTER — Encounter: Payer: Self-pay | Admitting: Family Medicine

## 2022-10-20 ENCOUNTER — Ambulatory Visit (INDEPENDENT_AMBULATORY_CARE_PROVIDER_SITE_OTHER): Payer: BC Managed Care – PPO | Admitting: Family Medicine

## 2022-10-20 VITALS — BP 111/73 | HR 60 | Temp 98.3°F | Ht 64.02 in | Wt 117.2 lb

## 2022-10-20 DIAGNOSIS — J351 Hypertrophy of tonsils: Secondary | ICD-10-CM | POA: Diagnosis not present

## 2022-10-20 DIAGNOSIS — J020 Streptococcal pharyngitis: Secondary | ICD-10-CM | POA: Diagnosis not present

## 2022-10-20 LAB — RAPID STREP SCREEN (MED CTR MEBANE ONLY): Strep Gp A Ag, IA W/Reflex: POSITIVE — AB

## 2022-10-20 MED ORDER — AMOXICILLIN 500 MG PO CAPS: 500.0000 mg | ORAL_CAPSULE | Freq: Two times a day (BID) | ORAL | 0 refills | Status: AC

## 2022-10-20 NOTE — Addendum Note (Signed)
Addended by: Dorcas Carrow on: 10/20/2022 03:51 PM   Modules accepted: Orders

## 2022-10-20 NOTE — Assessment & Plan Note (Signed)
Recurrent. Will treat with amoxicillin. Follow up with ENT.

## 2022-10-20 NOTE — Progress Notes (Signed)
BP 111/73   Pulse 60   Temp 98.3 F (36.8 C) (Oral)   Ht 5' 4.02" (1.626 m)   Wt 117 lb 3.2 oz (53.2 kg)   SpO2 99%   BMI 20.11 kg/m    Subjective:    Patient ID: Paula Brock, female    DOB: 07/11/2000, 22 y.o.   MRN: [id number]  HPI: Paula Brock is a 22 y.o. female  Chief Complaint  Patient presents with   tonsils swollen    Started saturday   UPPER RESPIRATORY TRACT INFECTION Duration: 3-4 days Worst symptom: swollen tonsil Fever: no Cough: yes Shortness of breath: no Wheezing: no Chest pain: no Chest tightness: no Chest congestion: no Nasal congestion: no Runny nose: no Post nasal drip: no Sneezing: no Sore throat: yes Swollen glands: no Sinus pressure: no Headache: no Face pain: no Toothache: no Ear pain: no  Ear pressure: no  Eyes red/itching:no Eye drainage/crusting: no  Vomiting: no Rash: no Fatigue: yes Sick contacts: no Strep contacts: no  Context: stable Recurrent sinusitis: no Relief with OTC cold/cough medications: no  Treatments attempted: none   Relevant past medical, surgical, family and social history reviewed and updated as indicated. Interim medical history since our last visit reviewed. Allergies and medications reviewed and updated.  Review of Systems  Constitutional: Negative.   HENT:  Positive for sore throat. Negative for congestion, dental problem, drooling, ear discharge, ear pain, facial swelling, hearing loss, mouth sores, nosebleeds, postnasal drip, rhinorrhea, sinus pressure, sinus pain, sneezing, tinnitus, trouble swallowing and voice change.   Respiratory:  Positive for cough. Negative for apnea, choking, chest tightness, shortness of breath, wheezing and stridor.   Cardiovascular: Negative.   Musculoskeletal: Negative.   Neurological: Negative.   Psychiatric/Behavioral: Negative.      Per HPI unless specifically indicated above     Objective:    BP 111/73   Pulse 60   Temp 98.3 F (36.8 C)  (Oral)   Ht 5' 4.02" (1.626 m)   Wt 117 lb 3.2 oz (53.2 kg)   SpO2 99%   BMI 20.11 kg/m   Wt Readings from Last 3 Encounters:  10/20/22 117 lb 3.2 oz (53.2 kg)  09/27/22 114 lb (51.7 kg)  08/11/22 113 lb 4.8 oz (51.4 kg)    Physical Exam Vitals and nursing note reviewed.  Constitutional:      General: She is not in acute distress.    Appearance: Normal appearance. She is not ill-appearing, toxic-appearing or diaphoretic.  HENT:     Head: Normocephalic and atraumatic.     Right Ear: External ear normal.     Left Ear: External ear normal.     Nose: Nose normal. No congestion or rhinorrhea.     Mouth/Throat:     Mouth: Mucous membranes are moist.     Pharynx: Oropharynx is clear. No oropharyngeal exudate or posterior oropharyngeal erythema.     Comments: R tonsil swollen Eyes:     General: No scleral icterus.       Right eye: No discharge.        Left eye: No discharge.     Extraocular Movements: Extraocular movements intact.     Conjunctiva/sclera: Conjunctivae normal.     Pupils: Pupils are equal, round, and reactive to light.  Cardiovascular:     Rate and Rhythm: Normal rate and regular rhythm.     Pulses: Normal pulses.     Heart sounds: Normal heart sounds. No murmur heard.    No  friction rub. No gallop.  Pulmonary:     Effort: Pulmonary effort is normal. No respiratory distress.     Breath sounds: Normal breath sounds. No stridor. No wheezing, rhonchi or rales.  Chest:     Chest wall: No tenderness.  Musculoskeletal:        General: Normal range of motion.     Cervical back: Normal range of motion and neck supple.  Skin:    General: Skin is warm and dry.     Capillary Refill: Capillary refill takes less than 2 seconds.     Coloration: Skin is not jaundiced or pale.     Findings: No bruising, erythema, lesion or rash.  Neurological:     General: No focal deficit present.     Mental Status: She is alert and oriented to person, place, and time. Mental status is at  baseline.  Psychiatric:        Mood and Affect: Mood normal.        Behavior: Behavior normal.        Thought Content: Thought content normal.        Judgment: Judgment normal.     Results for orders placed or performed in visit on 09/27/22  Rapid Strep screen([facility])   Specimen: Other   Other  Result Value Ref Range   Strep Gp A Ag, IA W/Reflex Negative Negative  Culture, Group A Strep   Other  Result Value Ref Range   Strep A Culture Positive (A)       Assessment & Plan:   Problem List Items Addressed This Visit   None Visit Diagnoses     Swollen tonsil    -  Primary   Has had recurrent tonsilitis. Will swab for strep and treat as needed. Follow up with ENT as needed.   Relevant Orders   Rapid Strep Screen ([facility] ONLY)        Follow up plan: Return if symptoms worsen or fail to improve.

## 2022-10-25 DIAGNOSIS — L700 Acne vulgaris: Secondary | ICD-10-CM | POA: Diagnosis not present

## 2022-10-25 DIAGNOSIS — L74510 Primary focal hyperhidrosis, axilla: Secondary | ICD-10-CM | POA: Diagnosis not present

## 2022-11-08 DIAGNOSIS — J3501 Chronic tonsillitis: Secondary | ICD-10-CM | POA: Diagnosis not present

## 2022-11-19 ENCOUNTER — Ambulatory Visit (INDEPENDENT_AMBULATORY_CARE_PROVIDER_SITE_OTHER): Payer: BC Managed Care – PPO | Admitting: Nurse Practitioner

## 2022-11-19 ENCOUNTER — Encounter: Payer: Self-pay | Admitting: Nurse Practitioner

## 2022-11-19 ENCOUNTER — Telehealth: Payer: Self-pay | Admitting: Nurse Practitioner

## 2022-11-19 VITALS — BP 93/60 | HR 66 | Temp 98.4°F | Wt 115.8 lb

## 2022-11-19 DIAGNOSIS — J029 Acute pharyngitis, unspecified: Secondary | ICD-10-CM | POA: Insufficient documentation

## 2022-11-19 LAB — RAPID STREP SCREEN (MED CTR MEBANE ONLY): Strep Gp A Ag, IA W/Reflex: NEGATIVE

## 2022-11-19 LAB — CULTURE, GROUP A STREP

## 2022-11-19 MED ORDER — AMOXICILLIN-POT CLAVULANATE 875-125 MG PO TABS: 1.0000 | ORAL_TABLET | Freq: Two times a day (BID) | ORAL | 0 refills | Status: AC

## 2022-11-19 NOTE — Assessment & Plan Note (Signed)
Acute, rapid strep negative but has significant symptoms and on exam significant erythema.  Will send in Augmentin, as took Amoxicillin last in June.  If culture returns negative will stop this, discussed with patient.  In past she has had negative rapid and positive culture.  Continue to collaborate with ENT.

## 2022-11-19 NOTE — Telephone Encounter (Signed)
Appointment has been made

## 2022-11-19 NOTE — Telephone Encounter (Signed)
Copied from CRM [id number]. Topic: Appointment Scheduling - Scheduling Inquiry for Clinic >> Nov 18, 2022  5:08 PM Epimenio Foot F wrote: Reason for CRM: Pt is calling in because she would like to know if she can be fit in sometime tomorrow or Monday for a throat swab.

## 2022-11-19 NOTE — Patient Instructions (Signed)
Sore Throat When you have a sore throat, your throat may feel: Tender. Burning. Irritated. Scratchy. Painful when you swallow. Painful when you talk. Many things can cause a sore throat, such as: An infection. Allergies. Dry air. Smoke or pollution. Radiation treatment for cancer. Gastroesophageal reflux disease (GERD). A tumor. A sore throat can be the first sign of another sickness. It can happen with other problems, like: Coughing. Sneezing. Fever. Swelling of the glands in the neck. Most sore throats go away without treatment. Follow these instructions at home:     Medicines Take over-the-counter and prescription medicines only as told by your doctor. Children often get sore throats. Do not give your child aspirin. Use throat sprays to soothe your throat as told by your health care provider. Managing pain To help with pain: Sip warm liquids, such as broth, herbal tea, or warm water. Eat or drink cold or frozen liquids, such as frozen ice pops. Rinse your mouth (gargle) with a salt water mixture 3-4 times a day or as needed. To make salt water, dissolve -1 tsp (3-6 g) of salt in 1 cup (237 mL) of warm water. Do not swallow this mixture. Suck on hard candy or throat lozenges. Put a cool-mist humidifier in your bedroom at night. Sit in the bathroom with the door closed for 5-10 minutes while you run hot water in the shower. General instructions Do not smoke or use any products that contain nicotine or tobacco. If you need help quitting, ask your doctor. Get plenty of rest. Drink enough fluid to keep your pee (urine) pale yellow. Wash your hands often for at least 20 seconds with soap and water. If soap and water are not available, use hand sanitizer. Contact a doctor if: You have a fever for more than 2-3 days. You keep having symptoms for more than 2-3 days. Your throat does not get better in 7 days. You have a fever and your symptoms suddenly get worse. Your  child who is 3 months to 3 years old has a temperature of 102.2F (39C) or higher. Get help right away if: You have trouble breathing. You cannot swallow fluids, soft foods, or your spit. You have swelling in your throat or neck that gets worse. You feel like you may vomit (nauseous) and this feeling lasts a long time. You cannot stop vomiting. These symptoms may be an emergency. Get help right away. Call your local emergency services (911 in the [place] Do not wait to see if the symptoms will go away. Do not drive yourself to the hospital. Summary A sore throat is a painful, burning, irritated, or scratchy throat. Many things can cause a sore throat. Take over-the-counter medicines only as told by your doctor. Get plenty of rest. Drink enough fluid to keep your pee (urine) pale yellow. Contact a doctor if your symptoms get worse or your sore throat does not get better within 7 days. This information is not intended to replace advice given to you by your health care provider. Make sure you discuss any questions you have with your health care provider. Document Revised: 07/09/2020 Document Reviewed: 07/09/2020 [employer] Patient Education  2024 [employer]  

## 2022-11-19 NOTE — Progress Notes (Signed)
Acute Office Visit  Subjective:     Patient ID: Paula Brock, female    DOB: July 10, 2000, 22 y.o.   MRN: [id number]  Chief Complaint  Patient presents with   Sore Throat    Pt states she has been having a sore and swollen throat for the last week.     Follows with ENT for recurrent strep, went last week.  Needs about 3-4 episodes of strep before they can do surgery.  Has currently had recurrence 4 times since April.  Current started one week ago with sore throat.    Sore Throat  This is a new problem. The current episode started 1 to 4 weeks ago. The problem has been unchanged. The pain is worse on the right side. There has been no fever. The pain is at a severity of 3/10. The pain is mild. Associated symptoms include ear pain (a little bit to right side), a hoarse voice and trouble swallowing (a little bit). Pertinent negatives include no abdominal pain, congestion, coughing, diarrhea, drooling, headaches, plugged ear sensation, shortness of breath or vomiting.   Patient is in today for sore throat.  Review of Systems  Constitutional:  Positive for malaise/fatigue. Negative for chills and fever.  HENT:  Positive for ear pain (a little bit to right side), hoarse voice, sore throat and trouble swallowing (a little bit). Negative for congestion, drooling and sinus pain.   Respiratory:  Negative for cough, hemoptysis, shortness of breath and wheezing.   Cardiovascular:  Negative for palpitations and leg swelling.  Gastrointestinal:  Negative for abdominal pain, diarrhea and vomiting.  Musculoskeletal:  Negative for myalgias.  Neurological:  Negative for headaches.  Psychiatric/Behavioral: Negative.        Objective:    BP 93/60   Pulse 66   Temp 98.4 F (36.9 C) (Oral)   Wt 115 lb 12.8 oz (52.5 kg)   SpO2 99%   BMI 19.87 kg/m  BP Readings from Last 3 Encounters:  11/19/22 93/60  10/20/22 111/73  09/27/22 108/73   Wt Readings from Last 3 Encounters:  11/19/22 115 lb  12.8 oz (52.5 kg)  10/20/22 117 lb 3.2 oz (53.2 kg)  09/27/22 114 lb (51.7 kg)   Physical Exam Vitals and nursing note reviewed.  Constitutional:      General: She is awake. She is not in acute distress.    Appearance: She is well-developed and well-groomed. She is not ill-appearing or toxic-appearing.  HENT:     Head: Normocephalic.     Right Ear: Hearing, tympanic membrane, ear canal and external ear normal. No middle ear effusion. There is no impacted cerumen. Tympanic membrane is not injected.     Left Ear: Hearing, tympanic membrane, ear canal and external ear normal.  No middle ear effusion. There is no impacted cerumen. Tympanic membrane is not injected.     Nose: No congestion or rhinorrhea.     Right Sinus: No maxillary sinus tenderness or frontal sinus tenderness.     Left Sinus: No maxillary sinus tenderness or frontal sinus tenderness.     Mouth/Throat:     Mouth: Mucous membranes are moist.     Pharynx: Posterior oropharyngeal erythema (moderate) present. No pharyngeal swelling or oropharyngeal exudate.     Tonsils: No tonsillar exudate or tonsillar abscesses. 2+ on the right. 2+ on the left.  Eyes:     General: Lids are normal.        Right eye: No discharge.  Left eye: No discharge.     Conjunctiva/sclera: Conjunctivae normal.     Pupils: Pupils are equal, round, and reactive to light.  Neck:     Thyroid: No thyromegaly.     Vascular: No carotid bruit.  Cardiovascular:     Rate and Rhythm: Normal rate and regular rhythm.     Heart sounds: Normal heart sounds. No murmur heard.    No gallop.  Pulmonary:     Effort: Pulmonary effort is normal. No accessory muscle usage or respiratory distress.     Breath sounds: Normal breath sounds.  Abdominal:     General: Bowel sounds are normal. There is no distension.     Palpations: Abdomen is soft.     Tenderness: There is no abdominal tenderness.  Musculoskeletal:     Cervical back: Normal range of motion and neck  supple.     Right lower leg: No edema.     Left lower leg: No edema.  Lymphadenopathy:     Head:     Right side of head: Tonsillar adenopathy present. No submental, submandibular, preauricular or posterior auricular adenopathy.     Left side of head: Submental and tonsillar adenopathy present. No submandibular, preauricular or posterior auricular adenopathy.     Cervical: No cervical adenopathy.  Skin:    General: Skin is warm and dry.  Neurological:     Mental Status: She is alert and oriented to person, place, and time.     Deep Tendon Reflexes: Reflexes are normal and symmetric.     Reflex Scores:      Brachioradialis reflexes are 2+ on the right side and 2+ on the left side.      Patellar reflexes are 2+ on the right side and 2+ on the left side. Psychiatric:        Attention and Perception: Attention normal.        Mood and Affect: Mood normal.        Speech: Speech normal.        Behavior: Behavior normal. Behavior is cooperative.        Thought Content: Thought content normal.    No results found for any visits on 11/19/22.      Assessment & Plan:   Problem List Items Addressed This Visit       Other   Sore throat - Primary    Acute, rapid strep negative but has significant symptoms and on exam significant erythema.  Will send in Augmentin, as took Amoxicillin last in June.  If culture returns negative will stop this, discussed with patient.  In past she has had negative rapid and positive culture.  Continue to collaborate with ENT.      Relevant Orders   Rapid Strep screen([facility])    Meds ordered this encounter  Medications   amoxicillin-clavulanate (AUGMENTIN) 875-125 MG tablet    Sig: Take 1 tablet by mouth 2 (two) times daily for 7 days.    Dispense:  14 tablet    Refill:  0    Return if symptoms worsen or fail to improve.  Marjie Skiff, NP

## 2022-11-22 NOTE — Progress Notes (Signed)
Contacted via MyChart   All strep testing negative:)

## 2022-11-23 ENCOUNTER — Ambulatory Visit: Payer: BC Managed Care – PPO | Admitting: Family Medicine

## 2022-12-06 ENCOUNTER — Ambulatory Visit (INDEPENDENT_AMBULATORY_CARE_PROVIDER_SITE_OTHER): Payer: BC Managed Care – PPO | Admitting: Nurse Practitioner

## 2022-12-06 ENCOUNTER — Encounter: Payer: Self-pay | Admitting: Nurse Practitioner

## 2022-12-06 VITALS — BP 106/72 | HR 91 | Temp 98.5°F | Wt 113.8 lb

## 2022-12-06 DIAGNOSIS — J029 Acute pharyngitis, unspecified: Secondary | ICD-10-CM | POA: Diagnosis not present

## 2022-12-06 LAB — RAPID STREP SCREEN (MED CTR MEBANE ONLY): Strep Gp A Ag, IA W/Reflex: NEGATIVE

## 2022-12-06 LAB — CULTURE, GROUP A STREP

## 2022-12-06 NOTE — Patient Instructions (Signed)
Strep Throat, Adult Strep throat is an infection of the throat. It is caused by germs (bacteria). Strep throat is common during the cold months of the year. It mostly affects children who are 33-21 years old. However, people of all ages can get it at any time of the year. This infection spreads from person to person through coughing, sneezing, or having close contact. What are the causes? This condition is caused by the Streptococcus pyogenes germ. What increases the risk? You care for young children. Children are more likely to get strep throat and may spread it to others. You go to crowded places. Germs can spread easily in such places. You kiss or touch someone who has strep throat. What are the signs or symptoms? Fever or chills. Redness, swelling, or pain in the tonsils or throat. Pain or trouble when swallowing. White or yellow spots on the tonsils or throat. Tender glands in the neck and under the jaw. Bad breath. Red rash all over the body. This is rare. How is this treated? Medicines that kill germs (antibiotics). Medicines that treat pain or fever. These include: Ibuprofen or acetaminophen. Aspirin, only for people who are over the age of 57. Cough drops. Throat sprays. Follow these instructions at home: Medicines  Take over-the-counter and prescription medicines only as told by your doctor. Take your antibiotic medicine as told by your doctor. Do not stop taking the antibiotic even if you start to feel better. Eating and drinking  If you have trouble swallowing, eat soft foods until your throat feels better. Drink enough fluid to keep your pee (urine) pale yellow. To help with pain, you may have: Warm fluids, such as soup and tea. Cold fluids, such as frozen desserts or popsicles. General instructions Rinse your mouth (gargle) with a salt-water mixture 3-4 times a day or as needed. To make a salt-water mixture, dissolve -1 tsp (3-6 g) of salt in 1 cup (237 mL) of warm  water. Rest as much as you can. Stay home from work or school until you have been taking antibiotics for 24 hours. Do not smoke or use any products that contain nicotine or tobacco. If you need help quitting, ask your doctor. Keep all follow-up visits. How is this prevented?  Do not share food, drinking cups, or personal items. They can cause the germs to spread. Wash your hands well with soap and water. Make sure that all people in your house wash their hands well. Have family members tested if they have a fever or a sore throat. They may need an antibiotic if they have strep throat. Contact a doctor if: You have swelling in your neck that keeps getting bigger. You get a rash, cough, or earache. You cough up a thick fluid that is green, yellow-brown, or bloody. You have pain that does not get better with medicine. Your symptoms get worse instead of getting better. You have a fever. Get help right away if: You vomit. You have a very bad headache. Your neck hurts or feels stiff. You have chest pain or are short of breath. You have drooling, very bad throat pain, or changes in your voice. Your neck is swollen, or the skin gets red and tender. Your mouth is dry, or you are peeing less than normal. You keep feeling more tired or have trouble waking up. Your joints are red or painful. These symptoms may be an emergency. Do not wait to see if the symptoms will go away. Get help right away. Call  your local emergency services (911 in the [place] Summary Strep throat is an infection of the throat. It is caused by germs (bacteria). This infection can spread from person to person through coughing, sneezing, or having close contact. Take your medicines, including antibiotics, as told by your doctor. Do not stop taking the antibiotic even if you start to feel better. To prevent the spread of germs, wash your hands well with soap and water. Have others do the same. Do not share food, drinking cups,  or personal items. Get help right away if you have a bad headache, chest pain, shortness of breath, a stiff or painful neck, or you vomit. This information is not intended to replace advice given to you by your health care provider. Make sure you discuss any questions you have with your health care provider. Document Revised: 08/05/2020 Document Reviewed: 08/05/2020 Elsevier Patient Education  2024 [employer].

## 2022-12-06 NOTE — Assessment & Plan Note (Signed)
Acute, rapid strep negative but has significant symptoms and on exam significant erythema.  Will send in Augmentin as needed based on final culture.  If culture returns negative will stop this, discussed with patient.  In past she has had negative rapid and positive culture.  Continue to collaborate with ENT.

## 2022-12-06 NOTE — Progress Notes (Signed)
Acute Office Visit  Subjective:     Patient ID: Paula Brock, female    DOB: 04-04-2001, 22 y.o.   MRN: [id number]  Chief Complaint  Patient presents with   Sore Throat   Follows with ENT for recurrent strep, went last week.  Needs about 3-4 episodes of strep before they can do surgery.  Has currently had recurrence 4 times since April.  Current started Thursday and worse the past days.  Worse the past two days.  Right side hurts more then left.  Sore Throat  This is a recurrent problem. The current episode started in the past 7 days. The problem has been gradually worsening. The pain is worse on the right side. There has been no fever. The pain is at a severity of 3/10. The pain is mild. Associated symptoms include coughing, a hoarse voice, a plugged ear sensation, swollen glands and trouble swallowing. Pertinent negatives include no abdominal pain, congestion, drooling, ear discharge, ear pain, headaches, neck pain, shortness of breath or vomiting. She has had no exposure to strep. She has tried NSAIDs, acetaminophen and gargles for the symptoms. The treatment provided mild relief.   Patient is in today for sore throat.  Review of Systems  Constitutional:  Positive for malaise/fatigue. Negative for chills, diaphoresis and fever.  HENT:  Positive for hoarse voice, sore throat and trouble swallowing. Negative for congestion, drooling, ear discharge, ear pain and sinus pain.   Respiratory:  Positive for cough. Negative for shortness of breath and wheezing.   Cardiovascular:  Negative for chest pain, palpitations and leg swelling.  Gastrointestinal:  Negative for abdominal pain and vomiting.  Musculoskeletal:  Negative for neck pain.  Neurological:  Negative for headaches.  Psychiatric/Behavioral: Negative.        Objective:    BP 106/72   Pulse 91   Temp 98.5 F (36.9 C) (Oral)   Wt 113 lb 12.8 oz (51.6 kg)   SpO2 98%   BMI 19.52 kg/m  BP Readings from Last 3  Encounters:  12/06/22 106/72  11/19/22 93/60  10/20/22 111/73   Wt Readings from Last 3 Encounters:  12/06/22 113 lb 12.8 oz (51.6 kg)  11/19/22 115 lb 12.8 oz (52.5 kg)  10/20/22 117 lb 3.2 oz (53.2 kg)      Physical Exam Vitals and nursing note reviewed.  Constitutional:      General: She is awake. She is not in acute distress.    Appearance: She is well-developed and well-groomed. She is obese. She is not ill-appearing or toxic-appearing.  HENT:     Head: Normocephalic.     Right Ear: Hearing, ear canal and external ear normal. A middle ear effusion is present. Tympanic membrane is not injected or perforated.     Left Ear: Hearing, ear canal and external ear normal. A middle ear effusion is present. Tympanic membrane is not injected or perforated.     Nose: No rhinorrhea.     Right Sinus: No maxillary sinus tenderness or frontal sinus tenderness.     Left Sinus: No maxillary sinus tenderness or frontal sinus tenderness.     Mouth/Throat:     Mouth: Mucous membranes are moist.     Pharynx: Posterior oropharyngeal erythema (mild) present. No pharyngeal swelling or oropharyngeal exudate.     Tonsils: Tonsillar exudate (small amount right side) present. No tonsillar abscesses. 2+ on the right. 2+ on the left.     Comments: Bilateral tonsils swollen, R>L. Eyes:     General:  Lids are normal.        Right eye: No discharge.        Left eye: No discharge.     Conjunctiva/sclera: Conjunctivae normal.     Pupils: Pupils are equal, round, and reactive to light.  Neck:     Thyroid: No thyromegaly.     Vascular: No carotid bruit.  Cardiovascular:     Rate and Rhythm: Normal rate and regular rhythm.     Heart sounds: Normal heart sounds. No murmur heard.    No gallop.  Pulmonary:     Effort: Pulmonary effort is normal. No accessory muscle usage or respiratory distress.     Breath sounds: No decreased breath sounds, wheezing or rhonchi.  Abdominal:     General: Bowel sounds are  normal.     Palpations: Abdomen is soft. There is no hepatomegaly or splenomegaly.  Musculoskeletal:     Cervical back: Normal range of motion and neck supple.     Right lower leg: No edema.     Left lower leg: No edema.  Lymphadenopathy:     Head:     Right side of head: Tonsillar adenopathy present. No submental, submandibular, preauricular or posterior auricular adenopathy.     Left side of head: Tonsillar adenopathy present. No submental, submandibular, preauricular or posterior auricular adenopathy.     Cervical: No cervical adenopathy.  Skin:    General: Skin is warm and dry.  Neurological:     Mental Status: She is alert and oriented to person, place, and time.  Psychiatric:        Attention and Perception: Attention normal.        Mood and Affect: Mood normal.        Speech: Speech normal.        Behavior: Behavior normal. Behavior is cooperative.        Thought Content: Thought content normal.    No results found for any visits on 12/06/22.      Assessment & Plan:   Problem List Items Addressed This Visit       Other   Sore throat - Primary    Acute, rapid strep negative but has significant symptoms and on exam significant erythema.  Will send in Augmentin as needed based on final culture.  If culture returns negative will stop this, discussed with patient.  In past she has had negative rapid and positive culture.  Continue to collaborate with ENT.      Relevant Orders   Rapid Strep Screen ([facility] ONLY)    No orders of the defined types were placed in this encounter.   Return if symptoms worsen or fail to improve.  Marjie Skiff, NP

## 2022-12-08 NOTE — Progress Notes (Signed)
Rapid strep results reviewed in office at apt. Waiting on culture results

## 2022-12-09 ENCOUNTER — Other Ambulatory Visit: Payer: Self-pay | Admitting: Physician Assistant

## 2022-12-09 DIAGNOSIS — J020 Streptococcal pharyngitis: Secondary | ICD-10-CM

## 2022-12-09 MED ORDER — AMOXICILLIN 500 MG PO CAPS
500.0000 mg | ORAL_CAPSULE | Freq: Two times a day (BID) | ORAL | 0 refills | Status: AC
Start: 2022-12-09 — End: 2022-12-19

## 2022-12-09 NOTE — Progress Notes (Signed)
Your strep culture was positive  I have sent in a script for Amoxicillin for you to take to help treat this. Please finish the entire course as directed unless you develop an allergic reaction or intolerable side effects.

## 2023-01-08 DIAGNOSIS — J029 Acute pharyngitis, unspecified: Secondary | ICD-10-CM | POA: Diagnosis not present

## 2023-01-08 DIAGNOSIS — R0789 Other chest pain: Secondary | ICD-10-CM | POA: Diagnosis not present

## 2023-01-13 DIAGNOSIS — E282 Polycystic ovarian syndrome: Secondary | ICD-10-CM | POA: Diagnosis not present

## 2023-01-13 DIAGNOSIS — N944 Primary dysmenorrhea: Secondary | ICD-10-CM | POA: Diagnosis not present

## 2023-02-14 ENCOUNTER — Encounter: Payer: Self-pay | Admitting: Nurse Practitioner

## 2023-02-14 ENCOUNTER — Ambulatory Visit (INDEPENDENT_AMBULATORY_CARE_PROVIDER_SITE_OTHER): Payer: BC Managed Care – PPO | Admitting: Nurse Practitioner

## 2023-02-14 VITALS — BP 101/68 | HR 68 | Temp 98.1°F | Wt 117.8 lb

## 2023-02-14 DIAGNOSIS — J029 Acute pharyngitis, unspecified: Secondary | ICD-10-CM | POA: Diagnosis not present

## 2023-02-14 LAB — RAPID STREP SCREEN (MED CTR MEBANE ONLY): Strep Gp A Ag, IA W/Reflex: POSITIVE — AB

## 2023-02-14 MED ORDER — AMOXICILLIN 500 MG PO CAPS: 500.0000 mg | ORAL_CAPSULE | Freq: Two times a day (BID) | ORAL | 0 refills | Status: AC

## 2023-02-14 MED ORDER — AMOXICILLIN 500 MG PO CAPS: 500.0000 mg | ORAL_CAPSULE | Freq: Two times a day (BID) | ORAL | 0 refills | Status: DC

## 2023-02-14 NOTE — Progress Notes (Signed)
BP 101/68   Pulse 68   Temp 98.1 F (36.7 C) (Oral)   Wt 117 lb 12.8 oz (53.4 kg)   LMP 01/22/2023 (Exact Date)   SpO2 99%   BMI 20.21 kg/m    Subjective:    Patient ID: Paula Brock, female    DOB: 21-Oct-2000, 21 y.o.   MRN: [id number]  HPI: Paula Brock is a 22 y.o. female  Chief Complaint  Patient presents with   Sore Throat   SORE THROAT Patient states she has been having a sore throat since Thursday.  Has history of 5 episodes of strep since April 2024.  She is seeing ENT and waiting for insurance approval for surgery.  She is having some ear pain and sore throat.   Relevant past medical, surgical, family and social history reviewed and updated as indicated. Interim medical history since our last visit reviewed. Allergies and medications reviewed and updated.  Review of Systems  HENT:  Positive for ear pain and sore throat.     Per HPI unless specifically indicated above     Objective:    BP 101/68   Pulse 68   Temp 98.1 F (36.7 C) (Oral)   Wt 117 lb 12.8 oz (53.4 kg)   LMP 01/22/2023 (Exact Date)   SpO2 99%   BMI 20.21 kg/m   Wt Readings from Last 3 Encounters:  02/14/23 117 lb 12.8 oz (53.4 kg)  12/06/22 113 lb 12.8 oz (51.6 kg)  11/19/22 115 lb 12.8 oz (52.5 kg)    Physical Exam Vitals and nursing note reviewed.  Constitutional:      General: She is not in acute distress.    Appearance: Normal appearance. She is normal weight. She is not ill-appearing, toxic-appearing or diaphoretic.  HENT:     Head: Normocephalic.     Right Ear: External ear normal. Tympanic membrane is erythematous.     Left Ear: External ear normal.     Nose: Nose normal.     Mouth/Throat:     Mouth: Mucous membranes are moist.     Pharynx: Oropharynx is clear.     Tonsils: Tonsillar exudate present. 2+ on the right.  Eyes:     General:        Right eye: No discharge.        Left eye: No discharge.     Extraocular Movements: Extraocular movements intact.      Conjunctiva/sclera: Conjunctivae normal.     Pupils: Pupils are equal, round, and reactive to light.  Cardiovascular:     Rate and Rhythm: Normal rate and regular rhythm.     Heart sounds: No murmur heard. Pulmonary:     Effort: Pulmonary effort is normal. No respiratory distress.     Breath sounds: Normal breath sounds. No wheezing or rales.  Musculoskeletal:     Cervical back: Normal range of motion and neck supple.  Skin:    General: Skin is warm and dry.     Capillary Refill: Capillary refill takes less than 2 seconds.  Neurological:     General: No focal deficit present.     Mental Status: She is alert and oriented to person, place, and time. Mental status is at baseline.  Psychiatric:        Mood and Affect: Mood normal.        Behavior: Behavior normal.        Thought Content: Thought content normal.        Judgment: Judgment normal.  Results for orders placed or performed in visit on 12/06/22  Rapid Strep Screen ([facility] ONLY)   Specimen: Other   Other  Result Value Ref Range   Strep Gp A Ag, IA W/Reflex Negative Negative  Culture, Group A Strep   Other  Result Value Ref Range   Strep A Culture Positive (A)       Assessment & Plan:   Problem List Items Addressed This Visit       Other   Sore throat - Primary    Has had 5 other episodes of strep throat since April 2024.  Awaiting approval from insurance for tonsil removal.  Typically has negative rapid test and positive culture.  Again in office today test was negative.  However, given erythema and enlarged tonsil on exam will treat with amoxicillin.  Will recommend patient stop medication if culture is negative.      Relevant Orders   Rapid Strep screen([facility])     Follow up plan: No follow-ups on file.

## 2023-02-14 NOTE — Assessment & Plan Note (Addendum)
Has had 5 other episodes of strep throat since April 2024.  Awaiting approval from insurance for tonsil removal.  Typically has negative rapid test and positive culture.  Her test in office today was positive.  Will treat with amoxicillin.  Will recommend patient stop medication if culture is negative.

## 2023-02-15 NOTE — Progress Notes (Signed)
Hi Tessi. Your Strep test did come back positive.  Make sure you complete your antibiotic.

## 2023-02-16 ENCOUNTER — Other Ambulatory Visit: Payer: Self-pay | Admitting: Otolaryngology

## 2023-03-03 DIAGNOSIS — J101 Influenza due to other identified influenza virus with other respiratory manifestations: Secondary | ICD-10-CM | POA: Diagnosis not present

## 2023-03-03 DIAGNOSIS — J020 Streptococcal pharyngitis: Secondary | ICD-10-CM | POA: Diagnosis not present

## 2023-03-14 DIAGNOSIS — J3501 Chronic tonsillitis: Secondary | ICD-10-CM | POA: Diagnosis not present

## 2023-03-22 ENCOUNTER — Encounter: Payer: Self-pay | Admitting: Otolaryngology

## 2023-04-01 NOTE — Discharge Instructions (Signed)
T & A INSTRUCTION SHEET - [facility]  P. SCOTT BENNETT, MD  INFORMATION SHEET FOR A TONSILLECTOMY AND ADENDOIDECTOMY  About Your Tonsils and Adenoids The tonsils and adenoids are normal body tissues that are part of our immune system. They normally help to protect us against diseases that may enter our mouth and nose. However, sometimes the tonsils and/or adenoids become too large and obstruct our breathing, especially at night.  If either of these things happen it helps to remove the tonsils and adenoids in order to become healthier. The operation to remove the tonsils and adenoids is called a tonsillectomy and adenoidectomy.  The Location of Your Tonsils and Adenoids The tonsils are located in the back of the throat on both side and sit in a cradle of muscles. The adenoids are located in the roof of the mouth, behind the nose, and closely associated with the opening of the Eustachian tube to the ear.  Surgery on Tonsils and Adenoids A tonsillectomy and adenoidectomy is a short operation which takes about thirty minutes. This includes being put to sleep and being awakened. Tonsillectomies and adenoidectomies are performed at [facility] and may require observation period in the recovery room prior to going home. Children are required to remain in the recovery area for 45 minutes after surgery.  Following the Operation for a Tonsillectomy A cautery machine is used to control bleeding.  Bleeding from a tonsillectomy and adenoidectomy is minimal and postoperatively the risk of bleeding is approximately four percent, although this rarely life threatening.  After your tonsillectomy and adenoidectomy post-op care at home: 1. Our patients are able to go home the same day. You may be given prescriptions for pain medications and antibiotics, if indicated. 2. It is extremely important to remember that fluid intake is of utmost importance after a  tonsillectomy. The amount that you drink must be maintained in the postoperative period. A good indication of whether a child is getting enough fluid is whether his/her urine output is constant.  As long as children are urinating or wetting their diaper every 6 - 8 hours this is usually enough fluid intake.   3. Although rare, this is a risk of some bleeding in the first ten days after surgery. This usually occurs between day five and nine postoperatively. This risk of bleeding is approximately four percent.  If you or your child should have any bleeding you should remain calm and notify our office or go directly to the Emergency Room at [facility] where they will contact us. Our doctors are available seven days a week for notification. We recommend sitting up quietly in a chair, place an ice pack on the front of the neck and spitting out the blood gently until we are able to contact you. Adults should gargle gently with ice water and this may help stop the bleeding. If the bleeding does not stop after a short time, i.e. 10 to 15 minutes, or seems to be increasing again, please contact us or go to the hospital.   4. It is common for the pain to be worse at 5 - 7 days postoperatively. This occurs because the "scab" is peeling off and the mucous membrane (skin of the throat) is growing back where the tonsils were.   5. It is common for a low-grade fever, less than 102, during the first week after a tonsillectomy and adenoidectomy. It is usually due to not drinking enough   liquids, and we suggest your use liquid Tylenol (acetaminophen) or the pain medicine with Tylenol (acetaminophen) prescribed in order to keep your temperature below 102. Please follow the directions on the back of the bottle. 6. Do not take aspirin or any products that contain aspirin such as Bufferin, Anacin, Ecotrin, aspirin gum, Goodies, BC headache powders, etc., after a T&A because it can promote bleeding.  DO NOT TAKE  MOTRIN OR IBUPROFEN. Please check with our office before administering any other medication that may been prescribed by other doctors during the two-week post-operative period. 7. If you happen to look in the mirror or into your child's mouth you will see white/gray patches on the back of the throat.  This is what a scab looks like in the mouth and is normal after having a tonsillectomy and adenoidectomy. It will disappear once the tonsil area heals completely. However, it may cause a noticeable odor, and this too will disappear with time.     8. You or your child may experience ear pain after having a tonsillectomy and adenoidectomy. This is called referred pain and comes from the throat, but it is felt in the ears. Ear pain is quite common and expected. It will usually go away after ten days. There is usually nothing wrong with the ears, and it is primarily due to the healing area stimulating the nerve to the ear that runs along the side of the throat. Use either the prescribed pain medicine or Tylenol (acetaminophen) as needed.  9. The throat tissues after a tonsillectomy are obviously sensitive. Smoking around children who have had a tonsillectomy significantly increases the risk of bleeding.  DO NOT SMOKE! What to Expect Each Day  First Day at Home 1. Patients will be discharged home the same day.  2. Drink at least four glasses of liquid a day. Clear, cool liquids are recommended. Fruit juices containing citric acid are not recommended because they tend to cause pain. Carbonated beverages are allowed if you pour them from glass to glass to remove the bubbles as these tend to cause discomfort. Avoid alcoholic beverages.  3. Eat very soft foods such as soups, broth, jello, custard, pudding, ice cream, popsicles, applesauce, mashed potatoes, and in general anything that you can crush between your tongue and the roof of your mouth. Try adding Carnation Instant Breakfast Mix into your food for extra  calories. It is not uncommon to lose 5 to 10 pounds of fluid weight. The weight will be gained back quickly once you're feeling better and drinking more.  4. Sleep with your head elevated on two pillows for about three days to help decrease the swelling.  5. DO NOT SMOKE!  Day Two  1. Rest as much as possible. Use common sense in your activities.  2. Continue drinking at least four glasses of liquid per day.  3. Follow the soft diet.  4. Use your pain medication as needed.  Day Three  1. Advance your activity as you are able and continue to follow the previous day's suggestions.  Days Four Through Six  1. Advance your diet and begin to eat more solid foods such as chopped hamburger. 2. Advance your activities slowly. Children should be kept mostly around the house.  3. Not uncommonly, there will be more pain at this time. It is temporary, usually lasting a day or two.  Day Seven Through Ten  1. Most individuals by this time are able to return to work or school unless otherwise instructed.   Consider sending children back to school for a half day on the first day back. 

## 2023-04-05 ENCOUNTER — Ambulatory Visit: Payer: Self-pay | Admitting: Anesthesiology

## 2023-04-05 ENCOUNTER — Other Ambulatory Visit: Payer: Self-pay

## 2023-04-05 ENCOUNTER — Encounter: Payer: Self-pay | Admitting: Nurse Practitioner

## 2023-04-05 ENCOUNTER — Ambulatory Visit
Admission: RE | Admit: 2023-04-05 | Discharge: 2023-04-05 | Disposition: A | Discharge status: Home / Self Care | Payer: BC Managed Care – PPO | Attending: Otolaryngology | Admitting: Otolaryngology

## 2023-04-05 ENCOUNTER — Encounter
Admission: RE | Disposition: A | Discharge status: Home / Self Care | Payer: Self-pay | Source: Home / Self Care | Attending: Otolaryngology

## 2023-04-05 ENCOUNTER — Encounter: Payer: Self-pay | Admitting: Otolaryngology

## 2023-04-05 DIAGNOSIS — J3501 Chronic tonsillitis: Secondary | ICD-10-CM | POA: Diagnosis not present

## 2023-04-05 DIAGNOSIS — G43919 Migraine, unspecified, intractable, without status migrainosus: Secondary | ICD-10-CM | POA: Diagnosis not present

## 2023-04-05 DIAGNOSIS — J351 Hypertrophy of tonsils: Secondary | ICD-10-CM | POA: Diagnosis not present

## 2023-04-05 HISTORY — DX: Other specified postprocedural states: Z98.890

## 2023-04-05 HISTORY — PX: TONSILLECTOMY: SHX5217

## 2023-04-05 HISTORY — PX: TONSILECTOMY, ADENOIDECTOMY, BILATERAL MYRINGOTOMY AND TUBES: SHX2538

## 2023-04-05 LAB — POCT PREGNANCY, URINE: Preg Test, Ur: NEGATIVE

## 2023-04-05 SURGERY — TONSILLECTOMY
Anesthesia: General | Laterality: Bilateral

## 2023-04-05 MED ORDER — SODIUM CHLORIDE 0.9 % IV SOLN
INTRAVENOUS | Status: DC
Start: 2023-04-05 — End: 2023-04-05

## 2023-04-05 MED ORDER — LACTATED RINGERS IV SOLN: INTRAVENOUS | Status: DC | PRN

## 2023-04-05 MED ORDER — OXYMETAZOLINE HCL 0.05 % NA SOLN
NASAL | Status: DC | PRN
  Administered 2023-04-05: 1 via TOPICAL

## 2023-04-05 MED ORDER — LIDOCAINE HCL (CARDIAC) PF 100 MG/5ML IV SOSY
PREFILLED_SYRINGE | INTRAVENOUS | Status: DC | PRN
  Administered 2023-04-05: 50 mg via INTRAVENOUS

## 2023-04-05 MED ORDER — OXYCODONE HCL 5 MG/5ML PO SOLN
ORAL | Status: AC
  Filled 2023-04-05: qty 10

## 2023-04-05 MED ORDER — ONDANSETRON 4 MG PO TBDP: 4.0000 mg | ORAL_TABLET | Freq: Three times a day (TID) | ORAL | 0 refills | Status: AC | PRN

## 2023-04-05 MED ORDER — PROPOFOL 10 MG/ML IV BOLUS
INTRAVENOUS | Status: AC
  Filled 2023-04-05: qty 20

## 2023-04-05 MED ORDER — ONDANSETRON HCL 4 MG/2ML IJ SOLN
INTRAMUSCULAR | Status: DC | PRN
  Administered 2023-04-05: 4 mg via INTRAVENOUS

## 2023-04-05 MED ORDER — PROPOFOL 10 MG/ML IV BOLUS
INTRAVENOUS | Status: DC | PRN
  Administered 2023-04-05: 200 mg via INTRAVENOUS

## 2023-04-05 MED ORDER — SCOPOLAMINE 1 MG/3DAYS TD PT72
MEDICATED_PATCH | TRANSDERMAL | Status: AC
  Filled 2023-04-05: qty 1

## 2023-04-05 MED ORDER — FENTANYL CITRATE (PF) 100 MCG/2ML IJ SOLN
INTRAMUSCULAR | Status: AC
  Filled 2023-04-05: qty 2

## 2023-04-05 MED ORDER — OXYCODONE HCL 5 MG/5ML PO SOLN
5.0000 mg | ORAL | Status: DC | PRN
  Administered 2023-04-05: 5 mg via ORAL

## 2023-04-05 MED ORDER — LIDOCAINE HCL (PF) 2 % IJ SOLN
INTRAMUSCULAR | Status: AC
  Filled 2023-04-05: qty 5

## 2023-04-05 MED ORDER — DEXMEDETOMIDINE HCL IN NACL 80 MCG/20ML IV SOLN
INTRAVENOUS | Status: AC
  Filled 2023-04-05: qty 20

## 2023-04-05 MED ORDER — LACTATED RINGERS IV SOLN: Freq: Once | INTRAVENOUS | Status: AC

## 2023-04-05 MED ORDER — ACETAMINOPHEN 10 MG/ML IV SOLN
1000.0000 mg | Freq: Once | INTRAVENOUS | Status: AC
  Administered 2023-04-05: 1000 mg via INTRAVENOUS

## 2023-04-05 MED ORDER — ACETAMINOPHEN 10 MG/ML IV SOLN
INTRAVENOUS | Status: AC
  Filled 2023-04-05: qty 100

## 2023-04-05 MED ORDER — DEXAMETHASONE SODIUM PHOSPHATE 4 MG/ML IJ SOLN
INTRAMUSCULAR | Status: DC | PRN
  Administered 2023-04-05: 8 mg via INTRAVENOUS

## 2023-04-05 MED ORDER — OXYCODONE-ACETAMINOPHEN 5-325 MG/5ML PO SOLN: ORAL | 0 refills | Status: DC

## 2023-04-05 MED ORDER — PREDNISOLONE SODIUM PHOSPHATE 15 MG/5ML PO SOLN
ORAL | 0 refills | Status: AC
Start: 2023-04-05 — End: ?

## 2023-04-05 MED ORDER — DEXMEDETOMIDINE HCL IN NACL 200 MCG/50ML IV SOLN
INTRAVENOUS | Status: DC | PRN
  Administered 2023-04-05: 8 ug via INTRAVENOUS
  Administered 2023-04-05: 12 ug via INTRAVENOUS

## 2023-04-05 MED ORDER — SCOPOLAMINE 1 MG/3DAYS TD PT72
1.0000 | MEDICATED_PATCH | TRANSDERMAL | Status: DC
  Administered 2023-04-05: 1.5 mg via TRANSDERMAL

## 2023-04-05 MED ORDER — OXYCODONE HCL 5 MG/5ML PO SOLN
ORAL | Status: AC
  Filled 2023-04-05: qty 5

## 2023-04-05 MED ORDER — FENTANYL CITRATE PF 50 MCG/ML IJ SOSY
25.0000 ug | PREFILLED_SYRINGE | INTRAMUSCULAR | Status: DC | PRN
  Administered 2023-04-05: 25 ug via INTRAVENOUS

## 2023-04-05 MED ORDER — MIDAZOLAM HCL 2 MG/2ML IJ SOLN
INTRAMUSCULAR | Status: AC
  Filled 2023-04-05: qty 2

## 2023-04-05 MED ORDER — SEVOFLURANE IN SOLN
RESPIRATORY_TRACT | Status: AC
  Filled 2023-04-05: qty 250

## 2023-04-05 MED ORDER — FENTANYL CITRATE (PF) 100 MCG/2ML IJ SOLN
INTRAMUSCULAR | Status: DC | PRN
  Administered 2023-04-05: 100 ug via INTRAVENOUS

## 2023-04-05 MED ORDER — DEXAMETHASONE SODIUM PHOSPHATE 4 MG/ML IJ SOLN
INTRAMUSCULAR | Status: AC
  Filled 2023-04-05: qty 2

## 2023-04-05 MED ORDER — MIDAZOLAM HCL 5 MG/5ML IJ SOLN
INTRAMUSCULAR | Status: DC | PRN
  Administered 2023-04-05: 2 mg via INTRAVENOUS

## 2023-04-05 MED ORDER — BUPIVACAINE HCL 0.25 % IJ SOLN
INTRAMUSCULAR | Status: DC | PRN
  Administered 2023-04-05: 3 mL

## 2023-04-05 MED ORDER — ONDANSETRON HCL 4 MG/2ML IJ SOLN
INTRAMUSCULAR | Status: AC
  Filled 2023-04-05: qty 2

## 2023-04-05 MED ORDER — SUCCINYLCHOLINE CHLORIDE 200 MG/10ML IV SOSY
PREFILLED_SYRINGE | INTRAVENOUS | Status: DC | PRN
  Administered 2023-04-05: 120 mg via INTRAVENOUS

## 2023-04-05 SURGICAL SUPPLY — 16 items
ANTIFOG SOL W/FOAM PAD STRL (MISCELLANEOUS) ×1
BLADE ELECT COATED/INSUL 125 (ELECTRODE) ×1 IMPLANT
CANISTER SUCT 1200ML W/VALVE (MISCELLANEOUS) ×1 IMPLANT
CATH SUCT ARGYLE CHIMNEY 10FR (CATHETERS) IMPLANT
ELECT REM PT RETURN 9FT ADLT (ELECTROSURGICAL) ×1
ELECTRODE REM PT RTRN 9FT ADLT (ELECTROSURGICAL) ×1 IMPLANT
GLOVE PI ULTRA LF STRL 7.5 (GLOVE) ×1 IMPLANT
KIT TURNOVER KIT A (KITS) ×1 IMPLANT
NS IRRIG 500ML POUR BTL (IV SOLUTION) ×1 IMPLANT
PACK TONSIL AND ADENOID CUSTOM (PACKS) ×1 IMPLANT
PENCIL SMOKE EVACUATOR (MISCELLANEOUS) ×1 IMPLANT
SLEEVE SUCTION 125 (MISCELLANEOUS) ×1 IMPLANT
SOLUTION ANTFG W/FOAM PAD STRL (MISCELLANEOUS) ×1 IMPLANT
SPONGE TONSIL 1 RF SGL (DISPOSABLE) IMPLANT
STRAP BODY AND KNEE 60X3 (MISCELLANEOUS) ×1 IMPLANT
SUCTION COAG ELEC 10 HAND CTRL (ELECTROSURGICAL) ×1 IMPLANT

## 2023-04-05 NOTE — Op Note (Signed)
04/05/2023  9:25 AM    Paula Brock  [id number]   Pre-Op Diagnosis:  Tonsillitis, chronic  Post-op Diagnosis: Tonsillitis, chronic  Procedure: Tonsillectomy  Surgeon:  Sandi Mealy., MD  Anesthesia:  General endotracheal  EBL:  Less than 25 cc  Complications:  None  Findings: 1-2+ cryptic tonsils  Procedure: The patient was taken to the Operating Room and placed in the supine position.  After induction of general endotracheal anesthesia, the table was turned 90 degrees and the patient was draped in the usual fashion  with the eyes protected.  A mouth gag was inserted into the oral cavity to open the mouth, and examination of the oropharynx showed the uvula was non-bifid. The palate was palpated, and there was no evidence of submucous cleft. Examination of the nasopharynx showed no obstructing adenoids. The right tonsil was grasped with an Allis clamp and resected from the tonsillar fossa in the usual fashion with the Bovie. The left tonsil was resected in the same fashion. The Bovie was used to obtain hemostasis. Each tonsillar fossa was then carefully injected with 0.25% marcaine , avoiding intravascular injection. The nose and throat were irrigated and suctioned to remove any  blood clot. The mouth gag was  removed with no evidence of active bleeding.  The patient was then returned to the anesthesiologist for awakening, and was taken to the Recovery Room in stable condition.  Cultures:  None.  Specimens:  Tonsils.  Disposition:   PACU to home  Plan: Soft, bland diet and push fluids. Take pain medications and prednisolone as prescribed. No strenuous activity for 2 weeks. Follow-up in 3 weeks.  Sandi Mealy 04/05/2023 9:25 AM

## 2023-04-05 NOTE — Anesthesia Preprocedure Evaluation (Addendum)
Anesthesia Evaluation  Patient identified by MRN, date of birth, ID band Patient awake    Reviewed: Allergy & Precautions, H&P , NPO status , Patient's Chart, lab work & pertinent test results  History of Anesthesia Complications (+) PONV and history of anesthetic complications  Airway Mallampati: I  TM Distance: >3 FB Neck ROM: Full    Dental no notable dental hx.    Pulmonary neg pulmonary ROS   Pulmonary exam normal breath sounds clear to auscultation       Cardiovascular negative cardio ROS Normal cardiovascular exam Rhythm:Regular Rate:Normal     Neuro/Psych  Headaches PSYCHIATRIC DISORDERS      negative neurological ROS  negative psych ROS   GI/Hepatic negative GI ROS, Neg liver ROS,,,  Endo/Other  negative endocrine ROS    Renal/GU negative Renal ROS  negative genitourinary   Musculoskeletal negative musculoskeletal ROS (+)    Abdominal   Peds negative pediatric ROS (+)  Hematology negative hematology ROS (+)   Anesthesia Other Findings PONV after dental surgery, wisdom teeth removed and car sickness and motion sickness  Mild headache today  Reproductive/Obstetrics negative OB ROS                             Anesthesia Physical Anesthesia Plan  ASA: 2  Anesthesia Plan: General ETT   Post-op Pain Management:    Induction: Intravenous  PONV Risk Score and Plan:   Airway Management Planned: Oral ETT  Additional Equipment:   Intra-op Plan:   Post-operative Plan: Extubation in OR  Informed Consent: I have reviewed the patients History and Physical, chart, labs and discussed the procedure including the risks, benefits and alternatives for the proposed anesthesia with the patient or authorized representative who has indicated his/her understanding and acceptance.     Dental Advisory Given  Plan Discussed with: Anesthesiologist, CRNA and Surgeon  Anesthesia Plan  Comments: (Patient consented for risks of anesthesia including but not limited to:  - adverse reactions to medications - damage to eyes, teeth, lips or other oral mucosa - nerve damage due to positioning  - sore throat or hoarseness - Damage to heart, brain, nerves, lungs, other parts of body or loss of life  Patient voiced understanding and assent.)       Anesthesia Quick Evaluation

## 2023-04-05 NOTE — Transfer of Care (Signed)
Immediate Anesthesia Transfer of Care Note  Patient: Paula Brock  Procedure(s) Performed: TONSILLECTOMY (Bilateral)  Patient Location: PACU  Anesthesia Type: General ETT  Level of Consciousness: awake, alert  and patient cooperative  Airway and Oxygen Therapy: Patient Spontanous Breathing and Patient connected to supplemental oxygen  Post-op Assessment: Post-op Vital signs reviewed, Patient's Cardiovascular Status Stable, Respiratory Function Stable, Patent Airway and No signs of Nausea or vomiting  Post-op Vital Signs: Reviewed and stable  Complications: No notable events documented.

## 2023-04-05 NOTE — H&P (Signed)
History and physical reviewed and will be scanned in later. No change in medical status reported by the patient or family, appears stable for surgery. All questions regarding the procedure answered, and patient (or family if a child) expressed understanding of the procedure. ? ?Paula Brock [name] ?@TODAY@ ?

## 2023-04-05 NOTE — Anesthesia Postprocedure Evaluation (Signed)
Anesthesia Post Note  Patient: Vela Serafino  Procedure(s) Performed: TONSILLECTOMY (Bilateral)  Patient location during evaluation: PACU Anesthesia Type: General Level of consciousness: awake and alert Pain management: pain level controlled Vital Signs Assessment: post-procedure vital signs reviewed and stable Respiratory status: spontaneous breathing, nonlabored ventilation, respiratory function stable and patient connected to nasal cannula oxygen Cardiovascular status: blood pressure returned to baseline and stable Postop Assessment: no apparent nausea or vomiting Anesthetic complications: no   No notable events documented.   Last Vitals:  Vitals:   04/05/23 1015 04/05/23 1026  BP: 102/74 107/74  Pulse: 73   Resp: 13   Temp:  (!) 36.3 C  SpO2: 98%     Last Pain:  Vitals:   04/05/23 1026  TempSrc:   PainSc: 7                  Keighley Deckman C Amal Renbarger

## 2023-04-05 NOTE — Anesthesia Procedure Notes (Signed)
Procedure Name: Intubation Date/Time: 04/05/2023 9:16 AM  Performed by: Domenic Moras, CRNAPre-anesthesia Checklist: Patient identified, Emergency Drugs available, Suction available and Patient being monitored Patient Re-evaluated:Patient Re-evaluated prior to induction Oxygen Delivery Method: Circle system utilized Preoxygenation: Pre-oxygenation with 100% oxygen Induction Type: IV induction Ventilation: Mask ventilation without difficulty Laryngoscope Size: Mac and 3 Grade View: Grade I Tube type: Oral Tube size: 6.5 mm Number of attempts: 1 Airway Equipment and Method: Stylet and Oral airway Placement Confirmation: ETT inserted through vocal cords under direct vision, positive ETCO2 and breath sounds checked- equal and bilateral Secured at: 19 cm Tube secured with: Tape Dental Injury: Teeth and Oropharynx as per pre-operative assessment

## 2023-04-06 ENCOUNTER — Encounter: Payer: Self-pay | Admitting: Otolaryngology

## 2023-04-07 LAB — SURGICAL PATHOLOGY

## 2023-04-09 DIAGNOSIS — E7800 Pure hypercholesterolemia, unspecified: Secondary | ICD-10-CM | POA: Insufficient documentation

## 2023-04-09 NOTE — Patient Instructions (Signed)

## 2023-04-12 ENCOUNTER — Ambulatory Visit (INDEPENDENT_AMBULATORY_CARE_PROVIDER_SITE_OTHER): Payer: BC Managed Care – PPO | Admitting: Nurse Practitioner

## 2023-04-12 ENCOUNTER — Encounter: Payer: Self-pay | Admitting: Nurse Practitioner

## 2023-04-12 VITALS — BP 97/62 | HR 75 | Temp 97.7°F | Resp 16 | Ht 64.0 in | Wt 118.4 lb

## 2023-04-12 DIAGNOSIS — R002 Palpitations: Secondary | ICD-10-CM

## 2023-04-12 DIAGNOSIS — G43019 Migraine without aura, intractable, without status migrainosus: Secondary | ICD-10-CM | POA: Diagnosis not present

## 2023-04-12 DIAGNOSIS — E7800 Pure hypercholesterolemia, unspecified: Secondary | ICD-10-CM | POA: Diagnosis not present

## 2023-04-12 DIAGNOSIS — Z0000 Encounter for general adult medical examination without abnormal findings: Secondary | ICD-10-CM | POA: Diagnosis not present

## 2023-04-12 NOTE — Assessment & Plan Note (Signed)
Noted on past labs, recheck today.  Continue diet and exercise focus.

## 2023-04-12 NOTE — Assessment & Plan Note (Signed)
Chronic, stable.  Followed by neurology in past, last visit in 2022.  Last note reviewed.  Will refill her medication as needed.

## 2023-04-12 NOTE — Progress Notes (Signed)
BP 97/62 (BP Location: Right Arm, Patient Position: Sitting, Cuff Size: Normal)   Pulse 75   Temp 97.7 F (36.5 C) (Oral)   Resp 16   Ht 5\' 4"  (1.626 m)   Wt 118 lb 6.4 oz (53.7 kg)   LMP 04/05/2023   SpO2 98%   BMI 20.32 kg/m    Subjective:    Patient ID: Paula Brock, female    DOB: 03/10/2001, 22 y.o.   MRN: [id number]  HPI: Graviela Maleeyah Brock is a 22 y.o. female presenting on 04/12/2023 for comprehensive medical examination. Current medical complaints include:none  She currently lives with: Menopausal Symptoms: no  Saw neurology in past for migraines, has not had a ton since last saw them 02/24/21.  Takes Nurtec if migraine presents, but does not have often. Has about 4-5 severe migraines a year.  Depression Screen done today and results listed below:     04/12/2023    8:45 AM 02/14/2023    3:09 PM 09/27/2022   10:24 AM 08/11/2022    9:00 AM 04/09/2022    1:17 PM  Depression screen PHQ 2/9  Decreased Interest 0 0 0 0 0  Down, Depressed, Hopeless 0 0 0 0 0  PHQ - 2 Score 0 0 0 0 0  Altered sleeping 0 0 0 0 1  Tired, decreased energy 0 0 0 1 1  Change in appetite 0 0 0 0 0  Feeling bad or failure about yourself  0 0 0 0 0  Trouble concentrating 0 0 0 0 0  Moving slowly or fidgety/restless 0 0 0 0 0  Suicidal thoughts 0 0 0 0 0  PHQ-9 Score 0 0 0 1 2  Difficult doing work/chores Not difficult at all Not difficult at all Not difficult at all Not difficult at all Not difficult at all      02/14/2023    3:09 PM 09/27/2022   10:24 AM 08/11/2022    9:01 AM 04/09/2022    1:18 PM  GAD 7 : Generalized Anxiety Score  Nervous, Anxious, on Edge 0 0 1 0  Control/stop worrying 0 0 0 0  Worry too much - different things 0 0 0 0  Trouble relaxing 0 0 0 0  Restless 0 0 0 0  Easily annoyed or irritable 0 0 0 0  Afraid - awful might happen 0 0 0 0  Total GAD 7 Score 0 0 1 0  Anxiety Difficulty Not difficult at all Not difficult at all Not difficult at all Not difficult  at all        04/09/2022    1:17 PM 08/11/2022    8:59 AM 09/27/2022   10:23 AM 02/14/2023    3:09 PM 04/12/2023    8:47 AM  Fall Risk  Falls in the past year? 0 0 0 0 0  Was there an injury with Fall? 0 0 0 0 0  Fall Risk Category Calculator 0 0 0 0 0  Fall Risk Category (Retired) Low      Patient at Risk for Falls Due to No Fall Risks History of fall(s) No Fall Risks No Fall Risks No Fall Risks  Fall risk Follow up Falls evaluation completed Falls evaluation completed Falls evaluation completed Falls evaluation completed     Past Medical History:  Past Medical History:  Diagnosis Date   ADHD (attention deficit hyperactivity disorder)    Endometriosis    Migraines    PONV (postoperative nausea and vomiting)  Reflux    occasionally   Wears contact lenses     Surgical History:  Past Surgical History:  Procedure Laterality Date   KNEE ARTHROSCOPY Left 04/09/2015   Procedure: ARTHROSCOPY KNEE WITH DEBRIDEMENT with LATERAL RELEASE;  Surgeon: Christena Flake, MD;  Location: [facility];  Service: Orthopedics;  Laterality: Left;  Latex   KNEE SURGERY Left    NO PAST SURGERIES     TONSILECTOMY, ADENOIDECTOMY, BILATERAL MYRINGOTOMY AND TUBES Bilateral 04/05/2023   TONSILLECTOMY Bilateral 04/05/2023   Procedure: TONSILLECTOMY;  Surgeon: Geanie Logan, MD;  Location: [facility];  Service: ENT;  Laterality: Bilateral;   WISDOM TOOTH EXTRACTION      Medications:  Current Outpatient Medications on File Prior to Visit  Medication Sig   cholecalciferol (VITAMIN D3) 25 MCG (1000 UNIT) tablet Take 1,000 Units by mouth daily.   cyanocobalamin (VITAMIN B12) 100 MCG tablet Take 100 mcg by mouth daily.   MAGNESIUM GLYCINATE PO Take 1 capsule by mouth daily.   ondansetron (ZOFRAN-ODT) 4 MG disintegrating tablet Take 1 tablet (4 mg total) by mouth every 8 (eight) hours as needed for nausea or vomiting.   prednisoLONE (ORAPRED) 15 MG/5ML solution 10 cc PO BID x 3 days, then 5  cc PO BID x 2 days, then 5 cc PO every day x 2 days   pyridoxine (B-6) 100 MG tablet Take 100 mg by mouth daily.   No current facility-administered medications on file prior to visit.    Allergies:  Allergies  Allergen Reactions   Latex Rash    After extended exposure    Social History:  Social History   Socioeconomic History   Marital status: Single    Spouse name: Not on file   Number of children: Not on file   Years of education: Not on file   Highest education level: Not on file  Occupational History   Occupation: real estate  Tobacco Use   Smoking status: Never   Smokeless tobacco: Never  Vaping Use   Vaping status: Never Used  Substance and Sexual Activity   Alcohol use: Yes    Comment: Occasional   Drug use: Not Currently   Sexual activity: Not Currently  Other Topics Concern   Not on file  Social History Narrative   Not on file   Social Drivers of Health   Financial Resource Strain: Low Risk  (04/12/2023)   Overall Financial Resource Strain (CARDIA)    Difficulty of Paying Living Expenses: Not hard at all  Food Insecurity: No Food Insecurity (04/12/2023)   Hunger Vital Sign    Worried About Running Out of Food in the Last Year: Never true    Ran Out of Food in the Last Year: Never true  Transportation Needs: No Transportation Needs (04/12/2023)   PRAPARE - Administrator, Civil Service (Medical): No    Lack of Transportation (Non-Medical): No  Physical Activity: Sufficiently Active (04/12/2023)   Exercise Vital Sign    Days of Exercise per Week: 7 days    Minutes of Exercise per Session: 60 min  Stress: No Stress Concern Present (04/12/2023)   [employer] of Occupational Health - Occupational Stress Questionnaire    Feeling of Stress : Not at all  Social Connections: Moderately Isolated (04/12/2023)   Social Connection and Isolation Panel [NHANES]    Frequency of Communication with Friends and Family: Once a week    Frequency of  Social Gatherings with Friends and Family: More than three times  a week    Attends Religious Services: More than 4 times per year    Active Member of Clubs or Organizations: No    Attends Banker Meetings: Never    Marital Status: Never married  Intimate Partner Violence: Not At Risk (04/12/2023)   Humiliation, Afraid, Rape, and Kick questionnaire    Fear of Current or Ex-Partner: No    Emotionally Abused: No    Physically Abused: No    Sexually Abused: No   Social History   Tobacco Use  Smoking Status Never  Smokeless Tobacco Never   Social History   Substance and Sexual Activity  Alcohol Use Yes   Comment: Occasional    Family History:  Family History  Problem Relation Age of Onset   Healthy Mother    Heart disease Father    Healthy Father    Cancer Maternal Grandfather    Diabetes Paternal Grandfather     Past medical history, surgical history, medications, allergies, family history and social history reviewed with patient today and changes made to appropriate areas of the chart.   ROS All other ROS negative except what is listed above and in the HPI.      Objective:    BP 97/62 (BP Location: Right Arm, Patient Position: Sitting, Cuff Size: Normal)   Pulse 75   Temp 97.7 F (36.5 C) (Oral)   Resp 16   Ht 5\' 4"  (1.626 m)   Wt 118 lb 6.4 oz (53.7 kg)   LMP 04/05/2023   SpO2 98%   BMI 20.32 kg/m   Wt Readings from Last 3 Encounters:  04/12/23 118 lb 6.4 oz (53.7 kg)  04/05/23 116 lb (52.6 kg)  02/14/23 117 lb 12.8 oz (53.4 kg)    Physical Exam Vitals and nursing note reviewed. Exam conducted with a chaperone present.  Constitutional:      General: She is awake. She is not in acute distress.    Appearance: She is well-developed and well-groomed. She is not ill-appearing or toxic-appearing.  HENT:     Head: Normocephalic and atraumatic.     Right Ear: Hearing, tympanic membrane, ear canal and external ear normal. No drainage.     Left Ear:  Hearing, tympanic membrane, ear canal and external ear normal. No drainage.     Nose: Nose normal.     Right Sinus: No maxillary sinus tenderness or frontal sinus tenderness.     Left Sinus: No maxillary sinus tenderness or frontal sinus tenderness.     Mouth/Throat:     Mouth: Mucous membranes are moist.     Pharynx: Oropharynx is clear. Uvula midline. No pharyngeal swelling, oropharyngeal exudate or posterior oropharyngeal erythema.  Eyes:     General: Lids are normal.        Right eye: No discharge.        Left eye: No discharge.     Extraocular Movements: Extraocular movements intact.     Conjunctiva/sclera: Conjunctivae normal.     Pupils: Pupils are equal, round, and reactive to light.     Visual Fields: Right eye visual fields normal and left eye visual fields normal.  Neck:     Thyroid: No thyromegaly.     Vascular: No carotid bruit.     Trachea: Trachea normal.  Cardiovascular:     Rate and Rhythm: Normal rate and regular rhythm.     Heart sounds: Normal heart sounds. No murmur heard.    No gallop.  Pulmonary:     Effort:  Pulmonary effort is normal. No accessory muscle usage or respiratory distress.     Breath sounds: Normal breath sounds.  Chest:  Breasts:    Right: Normal.     Left: Normal.  Abdominal:     General: Bowel sounds are normal.     Palpations: Abdomen is soft. There is no hepatomegaly or splenomegaly.     Tenderness: There is no abdominal tenderness.  Musculoskeletal:        General: Normal range of motion.     Cervical back: Normal range of motion and neck supple.     Right lower leg: No edema.     Left lower leg: No edema.  Lymphadenopathy:     Head:     Right side of head: No submental, submandibular, tonsillar, preauricular or posterior auricular adenopathy.     Left side of head: No submental, submandibular, tonsillar, preauricular or posterior auricular adenopathy.     Cervical: No cervical adenopathy.     Upper Body:     Right upper body: No  supraclavicular, axillary or pectoral adenopathy.     Left upper body: No supraclavicular, axillary or pectoral adenopathy.  Skin:    General: Skin is warm and dry.     Capillary Refill: Capillary refill takes less than 2 seconds.     Findings: No rash.  Neurological:     Mental Status: She is alert and oriented to person, place, and time.     Gait: Gait is intact.     Deep Tendon Reflexes: Reflexes are normal and symmetric.     Reflex Scores:      Brachioradialis reflexes are 2+ on the right side and 2+ on the left side.      Patellar reflexes are 2+ on the right side and 2+ on the left side. Psychiatric:        Attention and Perception: Attention normal.        Mood and Affect: Mood normal.        Speech: Speech normal.        Behavior: Behavior normal. Behavior is cooperative.        Thought Content: Thought content normal.        Judgment: Judgment normal.    Results for orders placed or performed during the hospital encounter of 04/05/23  Surgical pathology   Collection Time: 04/05/23 12:00 AM  Result Value Ref Range   SURGICAL PATHOLOGY      SURGICAL PATHOLOGY [facility] [address], [address] [place], [place] [address] Telephone [phone] or [phone] Fax [phone]  REPORT OF SURGICAL PATHOLOGY   Accession #: [id number] Patient Name: BLESSYN, ZWEIFEL Visit # : [id number]  MRN: [id number] Physician: Geanie Logan DOB/Age Nov 11, 2000 (Age: 22) Gender: F Collected Date: 04/05/2023 Received Date: 04/05/2023  FINAL DIAGNOSIS       1. Tonsil, right :       -  LYMPHOID FOLLICULAR HYPERPLASIA.       2. Tonsil, left :       -  LYMPHOID FOLLICULAR HYPERPLASIA.       ELECTRONIC SIGNATURE : [place] Md, Merchant navy officer, Sports administrator, International aid/development worker  MICROSCOPIC DESCRIPTION  CASE COMMENTS STAINS USED IN DIAGNOSIS: H&E H&E    CLINICAL HISTORY  SPECIMEN(S) OBTAINED 1. Tonsil, Right 2. Tonsil, Left  SPECIMEN COMMENTS: 1.  Patient does not have an active pregnancy episode SPECIMEN CLINICAL INFORMATION: 1. Tonsillitis, chronic    Gross Description 1. Specimen: R ight tonsil; received in formalin  Weight: 2.9 g (post fixation)      Size: 3.0 x 2.0 x 1.2 cm      Mucosal surface: Tan-gray and cerebriform; cauterized resection margin is inked      black.      Cut surfaces: Tan-white with faint, preserved crypt architecture; stones and      lesions are grossly absent.      Block summary: Representative sections are submitted in 1 block (1A). 2. Specimen: Left tonsil; received in formalin      Weight: 3.9 g (post fixation)      Size: 2.8 x 2.5 x 1.2 cm      Mucosal surface: Tan-gray and cerebriform; mildly disrupted (inked orange) on      the cauterized surface. The margin is inked black.      Cut surfaces: Tan-white with faint, preserved crypt architecture; stones and      lesions are grossly absent.      Block summary: Representative sections are submitted in 1 block (2A).      AMG 04/06/2023        Report signed out from the following location(s) [place]. [facility] [address], [place] [address] CLIA #: [id number]  [facility] [address] [place], [place] [address] CLIA #: [id number]   Pregnancy, urine POC   Collection Time: 04/05/23  7:47 AM  Result Value Ref Range   Preg Test, Ur NEGATIVE NEGATIVE      Assessment & Plan:   Problem List Items Addressed This Visit       Cardiovascular and Mediastinum   Intractable migraine without aura and without status migrainosus - Primary   Chronic, stable.  Followed by neurology in past, last visit in 2022.  Last note reviewed.  Will refill her medication as needed.      Relevant Orders   CBC with Differential/Platelet   Comprehensive metabolic panel   TSH     Other   Elevated low density lipoprotein (LDL) cholesterol level   Noted on past labs, recheck today.  Continue diet and exercise  focus.      Relevant Orders   Comprehensive metabolic panel   Lipid Panel w/o Chol/HDL Ratio   Other Visit Diagnoses       Encounter for annual physical exam       Annual physical today with labs and health maintenance reviewed, discussed with patient.        Follow up plan: Return in about 1 year (around 04/11/2024) for Annual Physical.   LABORATORY TESTING:  - Pap smear: will get with gynecology  IMMUNIZATIONS:   - Tdap: Tetanus vaccination status reviewed: refuses. - Influenza: Refused - Pneumovax: Not applicable - Prevnar: Not applicable - COVID: Refused - HPV: Up to date - Shingrix vaccine: Not applicable  SCREENING: -Mammogram: Not applicable  - Colonoscopy: Not applicable  - Bone Density: Not applicable  -Hearing Test: Not applicable  -Spirometry: Not applicable   PATIENT COUNSELING:   Advised to take 1 mg of folate supplement per day if capable of pregnancy.   Sexuality: Discussed sexually transmitted diseases, partner selection, use of condoms, avoidance of unintended pregnancy  and contraceptive alternatives.   Advised to avoid cigarette smoking.  I discussed with the patient that most people either abstain from alcohol or drink within safe limits (<=14/week and <=4 drinks/occasion for males, <=7/weeks and <= 3 drinks/occasion for females) and that the risk for alcohol disorders and other health effects rises proportionally with the number  of drinks per week and how often a drinker exceeds daily limits.  Discussed cessation/primary prevention of drug use and availability of treatment for abuse.   Diet: Encouraged to adjust caloric intake to maintain  or achieve ideal body weight, to reduce intake of dietary saturated fat and total fat, to limit sodium intake by avoiding high sodium foods and not adding table salt, and to maintain adequate dietary potassium and calcium preferably from fresh fruits, vegetables, and low-fat dairy products.    Stressed the  importance of regular exercise  Injury prevention: Discussed safety belts, safety helmets, smoke detector, smoking near bedding or upholstery.   Dental health: Discussed importance of regular tooth brushing, flossing, and dental visits.    NEXT PREVENTATIVE PHYSICAL DUE IN 1 YEAR. Return in about 1 year (around 04/11/2024) for Annual Physical.

## 2023-04-13 LAB — LIPID PANEL W/O CHOL/HDL RATIO
Cholesterol, Total: 174 mg/dL (ref 100–199)
HDL: 47 mg/dL (ref >39)
LDL Chol Calc (NIH): 106 mg/dL — ABNORMAL HIGH (ref 0–99)
Triglycerides: 116 mg/dL (ref 0–149)
VLDL Cholesterol Cal: 21 mg/dL (ref 5–40)

## 2023-04-13 LAB — CBC WITH DIFFERENTIAL/PLATELET
Basophils Absolute: 0 10*3/uL (ref 0.0–0.2)
Basos: 0 %
EOS (ABSOLUTE): 0.1 10*3/uL (ref 0.0–0.4)
Eos: 1 %
Hematocrit: 40.2 % (ref 34.0–46.6)
Hemoglobin: 13.1 g/dL (ref 11.1–15.9)
Immature Grans (Abs): 0 10*3/uL (ref 0.0–0.1)
Immature Granulocytes: 0 %
Lymphocytes Absolute: 3.3 10*3/uL — ABNORMAL HIGH (ref 0.7–3.1)
Lymphs: 55 %
MCH: 31.5 pg (ref 26.6–33.0)
MCHC: 32.6 g/dL (ref 31.5–35.7)
MCV: 97 fL (ref 79–97)
Monocytes Absolute: 0.4 10*3/uL (ref 0.1–0.9)
Monocytes: 8 %
Neutrophils Absolute: 2.1 10*3/uL (ref 1.4–7.0)
Neutrophils: 36 %
Platelets: 278 10*3/uL (ref 150–450)
RBC: 4.16 x10E6/uL (ref 3.77–5.28)
RDW: 12 % (ref 11.7–15.4)
WBC: 5.9 10*3/uL (ref 3.4–10.8)

## 2023-04-13 LAB — COMPREHENSIVE METABOLIC PANEL
ALT: 39 [IU]/L — ABNORMAL HIGH (ref 0–32)
AST: 24 [IU]/L (ref 0–40)
Albumin: 3.9 g/dL — ABNORMAL LOW (ref 4.0–5.0)
Alkaline Phosphatase: 52 [IU]/L (ref 44–121)
BUN/Creatinine Ratio: 21 (ref 9–23)
BUN: 14 mg/dL (ref 6–20)
Bilirubin Total: <0.2 mg/dL (ref 0.0–1.2)
CO2: 26 mmol/L (ref 20–29)
Calcium: 9.1 mg/dL (ref 8.7–10.2)
Chloride: 101 mmol/L (ref 96–106)
Creatinine, Ser: 0.66 mg/dL (ref 0.57–1.00)
Globulin, Total: 2.1 g/dL (ref 1.5–4.5)
Glucose: 75 mg/dL (ref 70–99)
Potassium: 4 mmol/L (ref 3.5–5.2)
Sodium: 141 mmol/L (ref 134–144)
Total Protein: 6 g/dL (ref 6.0–8.5)
eGFR: 128 mL/min/{1.73_m2} (ref >59)

## 2023-04-13 LAB — TSH: TSH: 2.36 u[IU]/mL (ref 0.450–4.500)

## 2023-04-13 NOTE — Progress Notes (Signed)
Contacted via MyChart   Good morning Albertina, your labs have returned: - CBC shows no anemia or infection. - Kidney function, creatinine and eGFR, remains normal. Liver function shows very mild elevation in ALT, but AST is normal.  We can monitor this. - LDL on labs, bad cholesterol, continues to show some elevation. Continue focus on healthy diet and regular exercise. - TSH, thyroid lab, is stable.  Any questions? Keep being amazing!!  Thank you for allowing me to participate in your care.  I appreciate you. Kindest regards, Anothony Bursch

## 2023-06-18 IMAGING — DX DG CHEST 2V
2 series · 2 of 2 positions shown · non-contrast
Comparison: None.

CLINICAL DATA: Three week history of productive cough with
shortness of breath and wheezing.

EXAM:
CHEST - 2 VIEW

[chest pa]
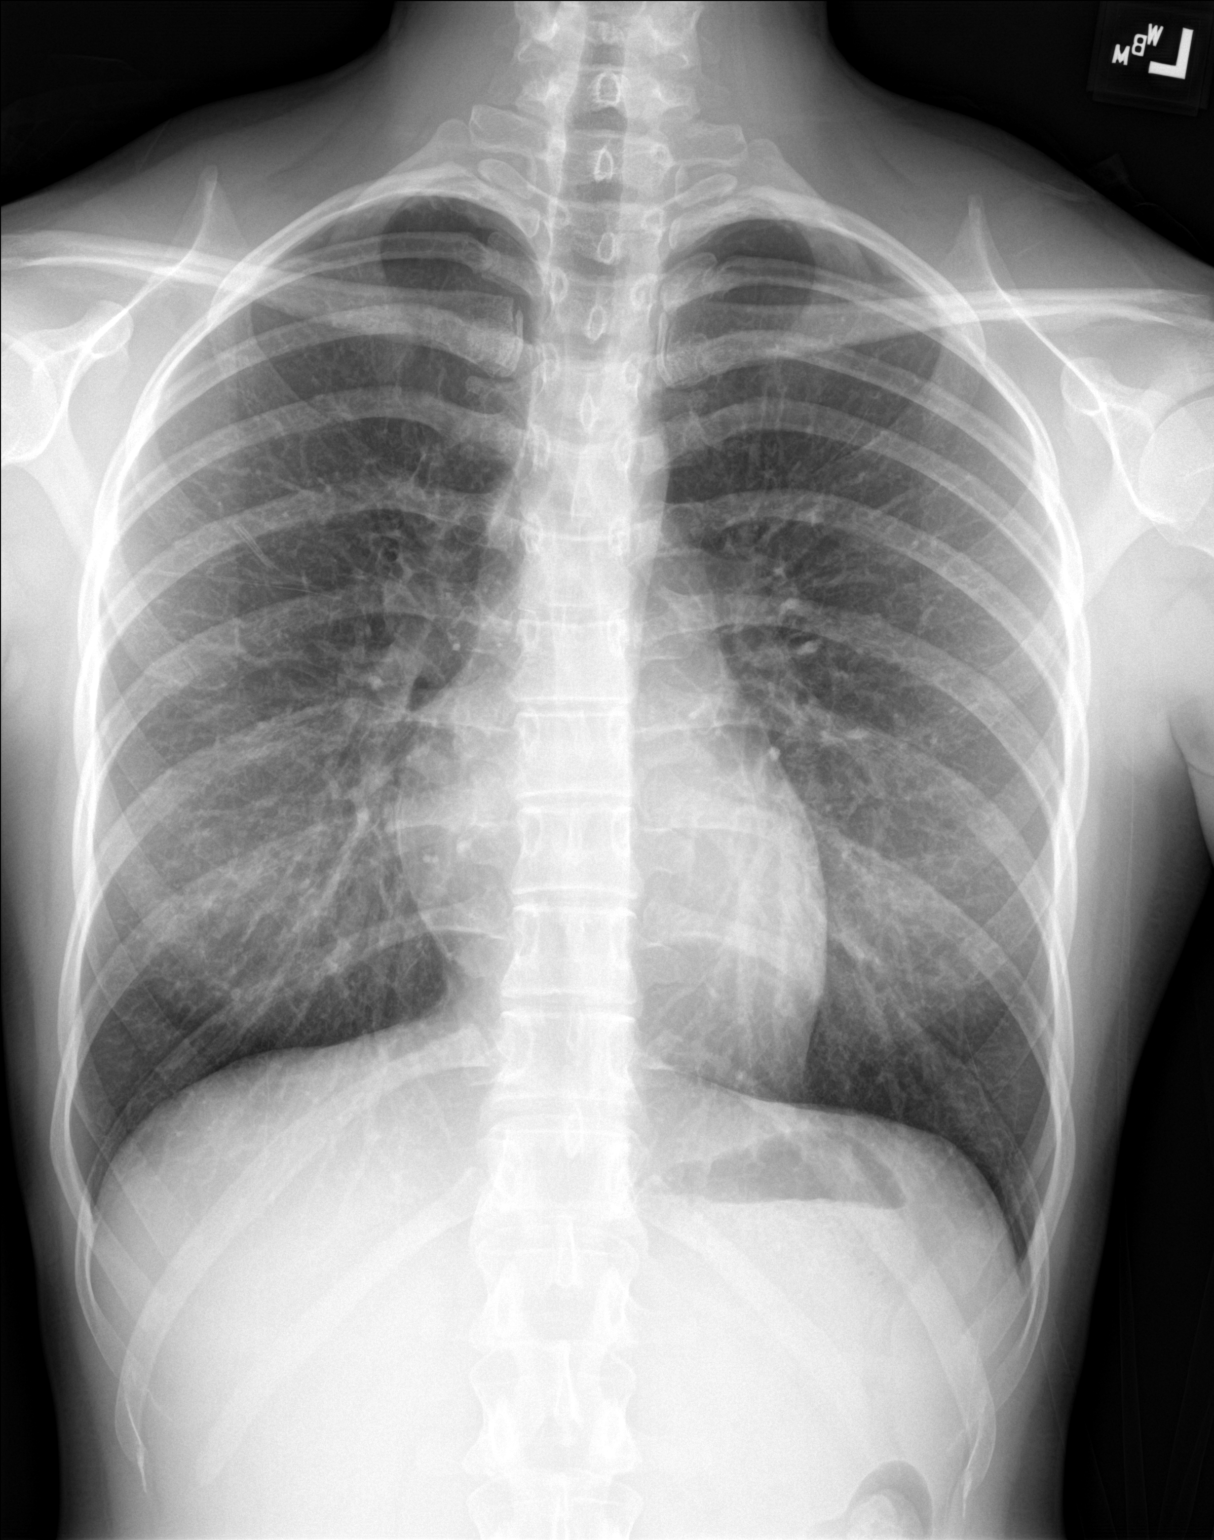

[chest lat]
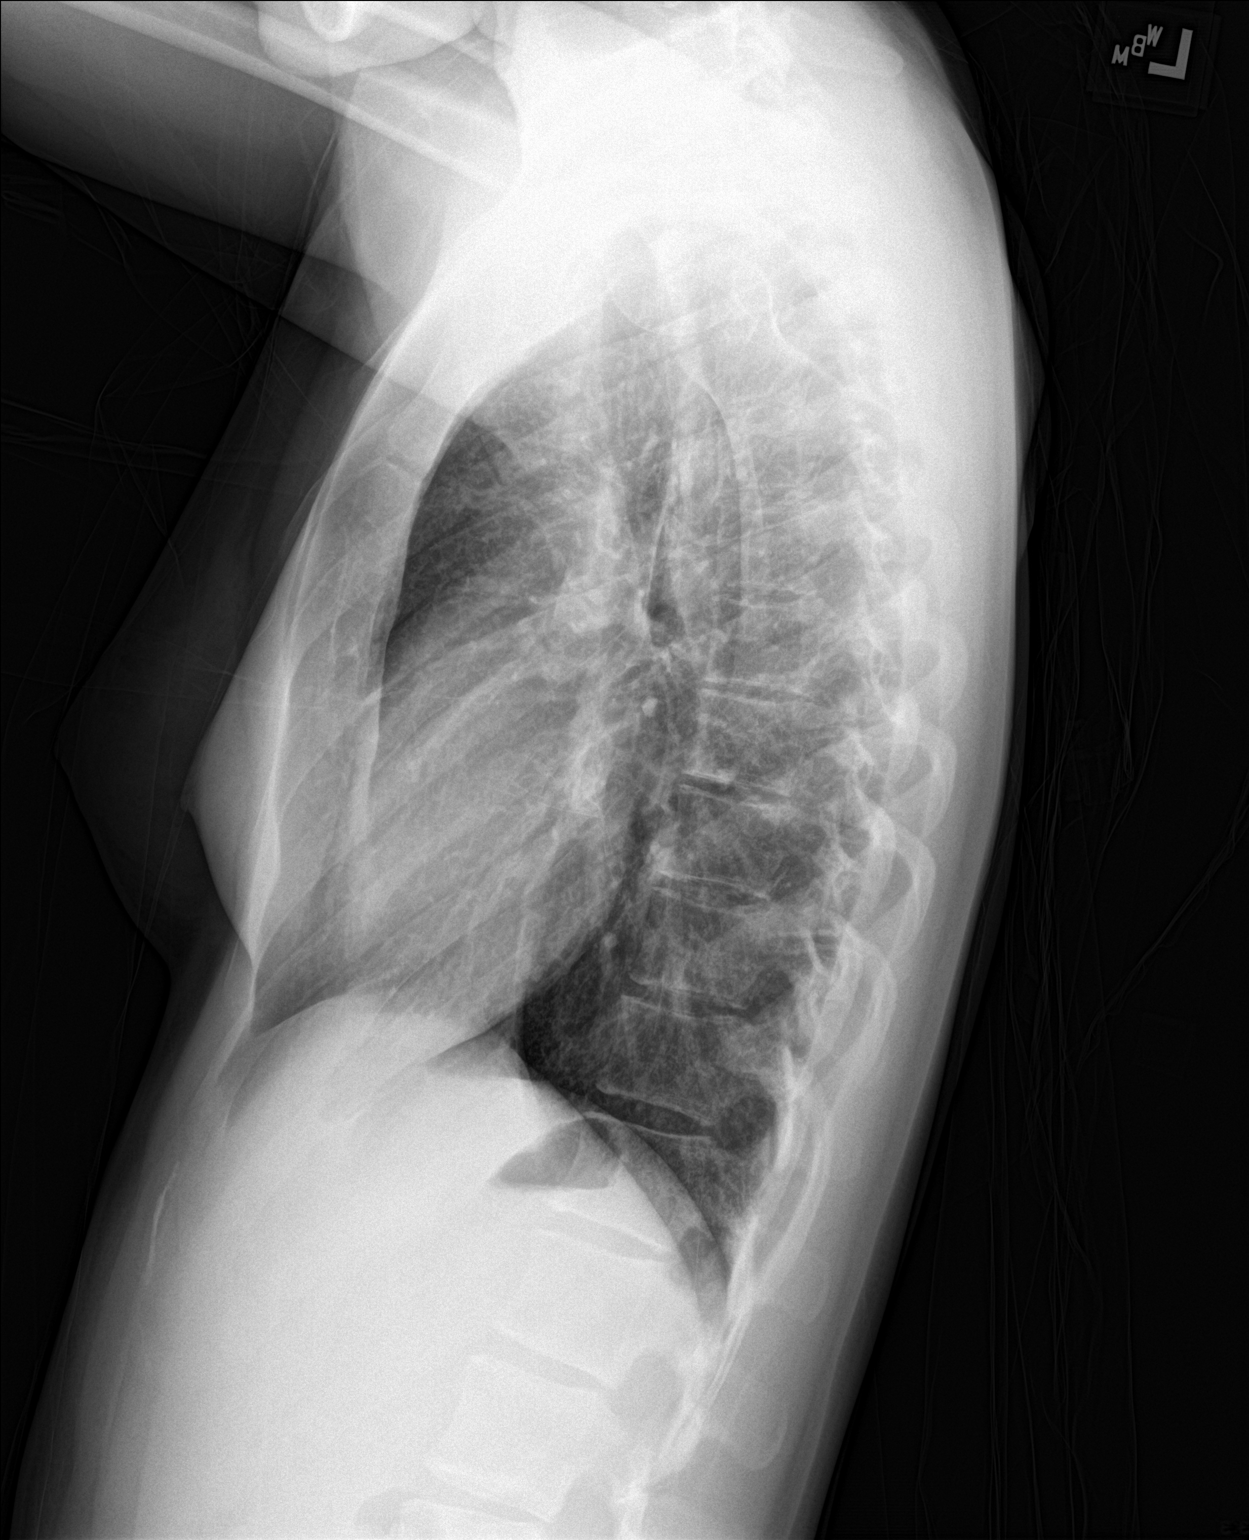

[2 of 2 positions shown; findings below may reference images not displayed]

FINDINGS: The heart size and mediastinal contours are within normal limits.
Both lungs are clear. The visualized skeletal structures are
unremarkable.
IMPRESSION: No active cardiopulmonary disease.

## 2023-10-25 DIAGNOSIS — Z308 Encounter for other contraceptive management: Secondary | ICD-10-CM | POA: Diagnosis not present

## 2023-12-14 DIAGNOSIS — F4322 Adjustment disorder with anxiety: Secondary | ICD-10-CM | POA: Diagnosis not present

## 2024-01-17 DIAGNOSIS — F4322 Adjustment disorder with anxiety: Secondary | ICD-10-CM | POA: Diagnosis not present

## 2024-01-25 DIAGNOSIS — F4322 Adjustment disorder with anxiety: Secondary | ICD-10-CM | POA: Diagnosis not present

## 2024-01-27 ENCOUNTER — Encounter: Payer: Self-pay | Admitting: Nurse Practitioner

## 2024-02-08 DIAGNOSIS — F4322 Adjustment disorder with anxiety: Secondary | ICD-10-CM | POA: Diagnosis not present

## 2024-02-09 DIAGNOSIS — H5201 Hypermetropia, right eye: Secondary | ICD-10-CM | POA: Diagnosis not present

## 2024-02-21 DIAGNOSIS — F4322 Adjustment disorder with anxiety: Secondary | ICD-10-CM | POA: Diagnosis not present

## 2024-02-23 DIAGNOSIS — S91311A Laceration without foreign body, right foot, initial encounter: Secondary | ICD-10-CM | POA: Diagnosis not present

## 2024-02-23 DIAGNOSIS — Z23 Encounter for immunization: Secondary | ICD-10-CM | POA: Diagnosis not present

## 2024-03-07 DIAGNOSIS — F4322 Adjustment disorder with anxiety: Secondary | ICD-10-CM | POA: Diagnosis not present

## 2024-04-04 DIAGNOSIS — F0781 Postconcussional syndrome: Secondary | ICD-10-CM | POA: Diagnosis not present

## 2024-04-04 DIAGNOSIS — R519 Headache, unspecified: Secondary | ICD-10-CM | POA: Diagnosis not present

## 2024-04-05 DIAGNOSIS — R55 Syncope and collapse: Secondary | ICD-10-CM | POA: Diagnosis not present

## 2024-04-05 DIAGNOSIS — F0781 Postconcussional syndrome: Secondary | ICD-10-CM | POA: Diagnosis not present

## 2024-04-08 NOTE — Patient Instructions (Signed)
 Be Involved in Caring For Your Health:  Taking Medications When medications are taken as directed, they can greatly improve your health. But if they are not taken as prescribed, they may not work. In some cases, not taking them correctly can be harmful. To help ensure your treatment remains effective and safe, understand your medications and how to take them. Bring your medications to each visit for review by your provider.  Your lab results, notes, and after visit summary will be available on My Chart. We strongly encourage you to use this feature. If lab results are abnormal the clinic will contact you with the appropriate steps. If the clinic does not contact you assume the results are satisfactory. You can always view your results on My Chart. If you have questions regarding your health or results, please contact the clinic during office hours. You can also ask questions on My Chart.  We at [facility] are grateful that you chose us  to provide your care. We strive to provide evidence-based and compassionate care and are always looking for feedback. If you get a survey from the clinic please complete this so we can hear your opinions.  Healthy Eating, Adult Healthy eating may help you get and keep a healthy body weight, reduce the risk of chronic disease, and live a long and productive life. It is important to follow a healthy eating pattern. Your nutritional and calorie needs should be met mainly by different nutrient-rich foods. What are tips for following this plan? Reading food labels Read labels and choose the following: Reduced or low sodium products. Juices with 100% fruit juice. Foods with low saturated fats (<3 g per serving) and high polyunsaturated and monounsaturated fats. Foods with whole grains, such as whole wheat, cracked wheat, brown rice, and wild rice. Whole grains that are fortified with folic acid. This is recommended for females who are pregnant or who want to  become pregnant. Read labels and do not eat or drink the following: Foods or drinks with added sugars. These include foods that contain brown sugar, corn sweetener, corn syrup, dextrose , fructose, glucose, high-fructose corn syrup, honey, invert sugar, lactose, malt syrup, maltose, molasses, raw sugar, sucrose, trehalose, or turbinado sugar. Limit your intake of added sugars to less than 10% of your total daily calories. Do not eat more than the following amounts of added sugar per day: 6 teaspoons (25 g) for females. 9 teaspoons (38 g) for males. Foods that contain processed or refined starches and grains. Refined grain products, such as white flour, degermed cornmeal, white bread, and white rice. Shopping Choose nutrient-rich snacks, such as vegetables, whole fruits, and nuts. Avoid high-calorie and high-sugar snacks, such as potato chips, fruit snacks, and candy. Use oil-based dressings and spreads on foods instead of solid fats such as butter, margarine, sour cream, or cream cheese. Limit pre-made sauces, mixes, and instant products such as flavored rice, instant noodles, and ready-made pasta. Try more plant-protein sources, such as tofu, tempeh, black beans, edamame, lentils, nuts, and seeds. Explore eating plans such as the Mediterranean diet or vegetarian diet. Try heart-healthy dips made with beans and healthy fats like hummus and guacamole. Vegetables go great with these. Cooking Use oil to saut or stir-fry foods instead of solid fats such as butter, margarine, or lard. Try baking, boiling, grilling, or broiling instead of frying. Remove the fatty part of meats before cooking. Steam vegetables in water  or broth. Meal planning  At meals, imagine dividing your plate into fourths: One-half of  your plate is fruits and vegetables. One-fourth of your plate is whole grains. One-fourth of your plate is protein, especially lean meats, poultry, eggs, tofu, beans, or nuts. Include low-fat  dairy as part of your daily diet. Lifestyle Choose healthy options in all settings, including home, work, school, restaurants, or stores. Prepare your food safely: Wash your hands after handling raw meats. Where you prepare food, keep surfaces clean by regularly washing with hot, soapy water . Keep raw meats separate from ready-to-eat foods, such as fruits and vegetables. Cook seafood, meat, poultry, and eggs to the recommended temperature. Get a food thermometer. Store foods at safe temperatures. In general: Keep cold foods at 84F (4.4C) or below. Keep hot foods at 184F (60C) or above. Keep your freezer at [facility] (-17.8C) or below. Foods are not safe to eat if they have been between the temperatures of 40-184F (4.4-60C) for more than 2 hours. What foods should I eat? Fruits Aim to eat 1-2 cups of fresh, canned (in natural juice), or frozen fruits each day. One cup of fruit equals 1 small apple, 1 large banana, 8 large strawberries, 1 cup (237 g) canned fruit,  cup (82 g) dried fruit, or 1 cup (240 mL) 100% juice. Vegetables Aim to eat 2-4 cups of fresh and frozen vegetables each day, including different varieties and colors. One cup of vegetables equals 1 cup (91 g) broccoli or cauliflower florets, 2 medium carrots, 2 cups (150 g) raw, leafy greens, 1 large tomato, 1 large bell pepper, 1 large sweet potato, or 1 medium white potato. Grains Aim to eat 5-10 ounce-equivalents of whole grains each day. Examples of 1 ounce-equivalent of grains include 1 slice of bread, 1 cup (40 g) ready-to-eat cereal, 3 cups (24 g) popcorn, or  cup (93 g) cooked rice. Meats and other proteins Try to eat 5-7 ounce-equivalents of protein each day. Examples of 1 ounce-equivalent of protein include 1 egg,  oz nuts (12 almonds, 24 pistachios, or 7 walnut halves), 1/4 cup (90 g) cooked beans, 6 tablespoons (90 g) hummus or 1 tablespoon (16 g) peanut butter. A cut of meat or fish that is the size of a deck of  cards is about 3-4 ounce-equivalents (85 g). Of the protein you eat each week, try to have at least 8 sounce (227 g) of seafood. This is about 2 servings per week. This includes salmon, trout, herring, sardines, and anchovies. Dairy Aim to eat 3 cup-equivalents of fat-free or low-fat dairy each day. Examples of 1 cup-equivalent of dairy include 1 cup (240 mL) milk, 8 ounces (250 g) yogurt, 1 ounces (44 g) natural cheese, or 1 cup (240 mL) fortified soy milk. Fats and oils Aim for about 5 teaspoons (21 g) of fats and oils per day. Choose monounsaturated fats, such as canola and olive oils, mayonnaise made with olive oil or avocado oil, avocados, peanut butter, and most nuts, or polyunsaturated fats, such as sunflower, corn, and soybean oils, walnuts, pine nuts, sesame seeds, sunflower seeds, and flaxseed. Beverages Aim for 6 eight-ounce glasses of water  per day. Limit coffee to 3-5 eight-ounce cups per day. Limit caffeinated beverages that have added calories, such as soda and energy drinks. If you drink alcohol: Limit how much you have to: 0-1 drink a day if you are female. 0-2 drinks a day if you are female. Know how much alcohol is in your drink. In the [place] one drink is one 12 oz bottle of beer (355 mL), one 5 oz glass of wine (  148 mL), or one 1 oz glass of hard liquor (44 mL). Seasoning and other foods Try not to add too much salt to your food. Try using herbs and spices instead of salt. Try not to add sugar to food. This information is based on [place] nutrition guidelines. To learn more, visit [employer]. Exact amounts may vary. You may need different amounts. This information is not intended to replace advice given to you by your health care provider. Make sure you discuss any questions you have with your health care provider. Document Revised: 01/11/2022 Document Reviewed: 01/11/2022 Elsevier Patient Education  2024 [employer].

## 2024-04-09 ENCOUNTER — Encounter: Payer: Self-pay | Admitting: Nurse Practitioner

## 2024-04-11 ENCOUNTER — Ambulatory Visit: Payer: Self-pay | Admitting: Nurse Practitioner

## 2024-04-11 ENCOUNTER — Encounter: Payer: Self-pay | Admitting: Nurse Practitioner

## 2024-04-11 VITALS — BP 106/72 | HR 78 | Temp 97.9°F | Resp 17 | Ht 64.02 in | Wt 123.4 lb

## 2024-04-11 DIAGNOSIS — R55 Syncope and collapse: Secondary | ICD-10-CM | POA: Insufficient documentation

## 2024-04-11 DIAGNOSIS — E7800 Pure hypercholesterolemia, unspecified: Secondary | ICD-10-CM | POA: Diagnosis not present

## 2024-04-11 DIAGNOSIS — S060XAA Concussion with loss of consciousness status unknown, initial encounter: Secondary | ICD-10-CM | POA: Insufficient documentation

## 2024-04-11 DIAGNOSIS — Z0000 Encounter for general adult medical examination without abnormal findings: Secondary | ICD-10-CM | POA: Diagnosis not present

## 2024-04-11 DIAGNOSIS — G43019 Migraine without aura, intractable, without status migrainosus: Secondary | ICD-10-CM | POA: Diagnosis not present

## 2024-04-11 DIAGNOSIS — R42 Dizziness and giddiness: Secondary | ICD-10-CM

## 2024-04-11 MED ORDER — NURTEC 75 MG PO TBDP: 1.0000 | ORAL_TABLET | Freq: Every day | ORAL | 1 refills | Status: AC | PRN

## 2024-04-11 MED ORDER — KETOROLAC TROMETHAMINE 10 MG PO TABS: 10.0000 mg | ORAL_TABLET | Freq: Four times a day (QID) | ORAL | 0 refills | Status: AC | PRN

## 2024-04-11 NOTE — Assessment & Plan Note (Signed)
 Noted on past labs, recheck today.  Continue diet and exercise focus.

## 2024-04-11 NOTE — Assessment & Plan Note (Signed)
 Chronic, stable.  Followed by neurology in past, last visit in 2022.  Last note reviewed.  Will refill her medication today: Nurtec and Toradol , since recent concussion has triggered migraines more. Referral to neurology for further assessment and recommendations. Neuro exam reassuring today.

## 2024-04-11 NOTE — Progress Notes (Signed)
 BP 106/72 (BP Location: Left Arm, Patient Position: Sitting, Cuff Size: Normal)   Pulse 78   Temp 97.9 F (36.6 C) (Oral)   Resp 17   Ht 5' 4.02 (1.626 m)   Wt 123 lb 6.4 oz (56 kg)   LMP 04/04/2024 (Exact Date)   SpO2 97%   BMI 21.17 kg/m    Subjective:    Patient ID: Paula Brock, female    DOB: 01/07/2001, 23 y.o.   MRN: [id number]  HPI: Paula Brock is a 23 y.o. female presenting on 04/11/2024 for comprehensive medical examination. Current medical complaints include:none  She currently lives with: self Menopausal Symptoms: no  Is seeing OB/GYN next month to discuss ovarian cysts.  SYNCOPAL EPISODE Was seen in ER on 04/04/24. Was in [place] when this initially happened, did drive back with concussion. Was able to get home but had some forgetfulness.  Has had syncopal episodes in past, but has been a few years and none were as bad as this recent one. Her boyfriend found her during this event, she was in bathroom and he heard her fall - it was middle of night and she was going to get water.  The episode lasted a minute or two. No CT scan was performed, she did not want to stay a few more hours to attain.  Last brain imaging was 10/13/20.    She is concerned about migraines returning. Has had headaches more steady since event and is taking Tylenol . Prior to this her migraines were not often. Was sen by cardiology and neurology in the past. Had reassuring work-up for palpitations in 2022. Duration: days Description of symptoms: ill-defined Duration of episode: minutes Recent head injury: yes Nausea: no Vomiting: no Tinnitus: no Hearing loss: no Aural fullness: no Headache: yes Photophobia/phonophobia: yes a little last week Unsteady gait: no Postural instability: no Diplopia, dysarthria, dysphagia or weakness: no Pallor: no Diaphoresis: no Dyspnea: no Chest pain: no   Depression Screen done today and results listed below:     04/11/2024    8:56 AM  04/12/2023    8:45 AM 02/14/2023    3:09 PM 09/27/2022   10:24 AM 08/11/2022    9:00 AM  Depression screen PHQ 2/9  Decreased Interest 0 0 0 0 0  Down, Depressed, Hopeless 0 0 0 0 0  PHQ - 2 Score 0 0 0 0 0  Altered sleeping 0 0 0 0 0  Tired, decreased energy 0 0 0 0 1  Change in appetite 0 0 0 0 0  Feeling bad or failure about yourself  0 0 0 0 0  Trouble concentrating 0 0 0 0 0  Moving slowly or fidgety/restless 0 0 0 0 0  Suicidal thoughts 0 0 0 0 0  PHQ-9 Score 0 0  0  0  1   Difficult doing work/chores  Not difficult at all Not difficult at all Not difficult at all Not difficult at all     Data saved with a previous flowsheet row definition      04/11/2024    8:56 AM 02/14/2023    3:09 PM 09/27/2022   10:24 AM 08/11/2022    9:01 AM  GAD 7 : Generalized Anxiety Score  Nervous, Anxious, on Edge 0 0 0 1  Control/stop worrying 0 0 0 0  Worry too much - different things 0 0 0 0  Trouble relaxing 0 0 0 0  Restless 0 0 0 0  Easily annoyed or  irritable 0 0 0 0  Afraid - awful might happen 0 0 0 0  Total GAD 7 Score 0 0 0 1  Anxiety Difficulty  Not difficult at all Not difficult at all Not difficult at all        08/11/2022    8:59 AM 09/27/2022   10:23 AM 02/14/2023    3:09 PM 04/12/2023    8:47 AM 04/11/2024    8:56 AM  Fall Risk  Falls in the past year? 0 0 0 0 0  Was there an injury with Fall? 0  0  0  0  0  Fall Risk Category Calculator 0 0 0 0 0  Patient at Risk for Falls Due to History of fall(s) No Fall Risks No Fall Risks No Fall Risks No Fall Risks  Fall risk Follow up Falls evaluation completed Falls evaluation completed Falls evaluation completed  Falls evaluation completed     Data saved with a previous flowsheet row definition    Past Medical History:  Past Medical History:  Diagnosis Date   ADHD (attention deficit hyperactivity disorder)    Endometriosis    Migraines    PONV (postoperative nausea and vomiting)    Reflux    occasionally   Wears contact  lenses     Surgical History:  Past Surgical History:  Procedure Laterality Date   KNEE ARTHROSCOPY Left 04/09/2015   Procedure: ARTHROSCOPY KNEE WITH DEBRIDEMENT with LATERAL RELEASE;  Surgeon: Norleen JINNY Maltos, MD;  Location: [facility];  Service: Orthopedics;  Laterality: Left;  Latex   KNEE SURGERY Left    NO PAST SURGERIES     TONSILECTOMY, ADENOIDECTOMY, BILATERAL MYRINGOTOMY AND TUBES Bilateral 04/05/2023   TONSILLECTOMY Bilateral 04/05/2023   Procedure: TONSILLECTOMY;  Surgeon: Blair Mt, MD;  Location: [facility];  Service: ENT;  Laterality: Bilateral;   WISDOM TOOTH EXTRACTION      Medications:  Current Outpatient Medications on File Prior to Visit  Medication Sig   cholecalciferol (VITAMIN D3) 25 MCG (1000 UNIT) tablet Take 1,000 Units by mouth daily.   cyanocobalamin (VITAMIN B12) 100 MCG tablet Take 100 mcg by mouth daily.   MAGNESIUM GLYCINATE PO Take 1 capsule by mouth daily.   Omega-3 Fatty Acids (FISH OIL PO) Take 1 each by mouth daily.   ondansetron  (ZOFRAN -ODT) 4 MG disintegrating tablet Take 1 tablet (4 mg total) by mouth every 8 (eight) hours as needed for nausea or vomiting.   UNABLE TO FIND Take 1 each by mouth daily. Med Name: [facility] Mushroom capsule   No current facility-administered medications on file prior to visit.    Allergies:  Allergies  Allergen Reactions   Latex Rash    After extended exposure    Social History:  Social History   Socioeconomic History   Marital status: Single    Spouse name: Not on file   Number of children: Not on file   Years of education: Not on file   Highest education level: Not on file  Occupational History   Occupation: real estate  Tobacco Use   Smoking status: Never   Smokeless tobacco: Never  Vaping Use   Vaping status: Never Used  Substance and Sexual Activity   Alcohol use: Yes    Comment: Occasional   Drug use: Not Currently   Sexual activity: Not Currently  Other Topics  Concern   Not on file  Social History Narrative   Not on file   Social Drivers of Health  Tobacco Use: Low Risk (04/11/2024)   Patient History    Smoking Tobacco Use: Never    Smokeless Tobacco Use: Never    Passive Exposure: Not on file  Financial Resource Strain: Low Risk (04/12/2023)   Overall Financial Resource Strain (CARDIA)    Difficulty of Paying Living Expenses: Not hard at all  Food Insecurity: No Food Insecurity (04/12/2023)   Hunger Vital Sign    Worried About Running Out of Food in the Last Year: Never true    Ran Out of Food in the Last Year: Never true  Transportation Needs: No Transportation Needs (04/12/2023)   PRAPARE - Administrator, Civil Service (Medical): No    Lack of Transportation (Non-Medical): No  Physical Activity: Sufficiently Active (04/12/2023)   Exercise Vital Sign    Days of Exercise per Week: 7 days    Minutes of Exercise per Session: 60 min  Stress: No Stress Concern Present (04/12/2023)   [employer] of Occupational Health - Occupational Stress Questionnaire    Feeling of Stress : Not at all  Social Connections: Moderately Isolated (04/12/2023)   Social Connection and Isolation Panel    Frequency of Communication with Friends and Family: Once a week    Frequency of Social Gatherings with Friends and Family: More than three times a week    Attends Religious Services: More than 4 times per year    Active Member of [employer] or Organizations: No    Attends Banker Meetings: Never    Marital Status: Never married  Intimate Partner Violence: Not At Risk (04/04/2024)   Received from [facility]   Epic    Within the last year, have you been afraid of your partner or ex-partner?: No    Within the last year, have you been humiliated or emotionally abused in other ways by your partner or ex-partner?: No    Within the last year, have you been kicked, hit, slapped, or otherwise physically hurt by your partner or  ex-partner?: No    Within the last year, have you been raped or forced to have any kind of sexual activity by your partner or ex-partner?: No  Depression (PHQ2-9): Low Risk (04/11/2024)   Depression (PHQ2-9)    PHQ-2 Score: 0  Alcohol Screen: Low Risk (04/12/2023)   Alcohol Screen    Last Alcohol Screening Score (AUDIT): 1  Housing: Unknown (04/12/2023)   Housing Stability Vital Sign    Unable to Pay for Housing in the Last Year: No    Number of Times Moved in the Last Year: Not on file    Homeless in the Last Year: No  Utilities: Not At Risk (04/12/2023)   AHC Utilities    Threatened with loss of utilities: No  Health Literacy: Not on file   Social History   Tobacco Use  Smoking Status Never  Smokeless Tobacco Never   Social History   Substance and Sexual Activity  Alcohol Use Yes   Comment: Occasional    Family History:  Family History  Problem Relation Age of Onset   Healthy Mother    Heart disease Father    Healthy Father    Cancer Maternal Grandfather    Diabetes Paternal Grandfather     Past medical history, surgical history, medications, allergies, family history and social history reviewed with patient today and changes made to appropriate areas of the chart.   ROS All other ROS negative except what is listed above and in the HPI.  Objective:    BP 106/72 (BP Location: Left Arm, Patient Position: Sitting, Cuff Size: Normal)   Pulse 78   Temp 97.9 F (36.6 C) (Oral)   Resp 17   Ht 5' 4.02 (1.626 m)   Wt 123 lb 6.4 oz (56 kg)   LMP 04/04/2024 (Exact Date)   SpO2 97%   BMI 21.17 kg/m   Wt Readings from Last 3 Encounters:  04/11/24 123 lb 6.4 oz (56 kg)  04/12/23 118 lb 6.4 oz (53.7 kg)  04/05/23 116 lb (52.6 kg)    Physical Exam Vitals and nursing note reviewed. Exam conducted with a chaperone present.  Constitutional:      General: She is awake. She is not in acute distress.    Appearance: She is well-developed and well-groomed. She is  not ill-appearing or toxic-appearing.  HENT:     Head: Normocephalic and atraumatic.     Right Ear: Hearing, tympanic membrane, ear canal and external ear normal. No drainage.     Left Ear: Hearing, tympanic membrane, ear canal and external ear normal. No drainage.     Nose: Nose normal.     Right Sinus: No maxillary sinus tenderness or frontal sinus tenderness.     Left Sinus: No maxillary sinus tenderness or frontal sinus tenderness.     Mouth/Throat:     Mouth: Mucous membranes are moist.     Pharynx: Oropharynx is clear. Uvula midline. No pharyngeal swelling, oropharyngeal exudate or posterior oropharyngeal erythema.  Eyes:     General: Lids are normal.        Right eye: No discharge.        Left eye: No discharge.     Extraocular Movements: Extraocular movements intact.     Conjunctiva/sclera: Conjunctivae normal.     Pupils: Pupils are equal, round, and reactive to light.     Visual Fields: Right eye visual fields normal and left eye visual fields normal.  Neck:     Thyroid: No thyromegaly.     Vascular: No carotid bruit.     Trachea: Trachea normal.  Cardiovascular:     Rate and Rhythm: Normal rate and regular rhythm.     Heart sounds: Normal heart sounds. No murmur heard.    No gallop.  Pulmonary:     Effort: Pulmonary effort is normal. No accessory muscle usage or respiratory distress.     Breath sounds: Normal breath sounds. No decreased breath sounds, wheezing or rales.  Chest:  Breasts:    Right: Normal.     Left: Normal.  Abdominal:     General: Bowel sounds are normal.     Palpations: Abdomen is soft. There is no hepatomegaly or splenomegaly.     Tenderness: There is no abdominal tenderness.  Musculoskeletal:        General: Normal range of motion.     Cervical back: Normal range of motion and neck supple.     Right lower leg: No edema.     Left lower leg: No edema.  Lymphadenopathy:     Head:     Right side of head: No submental, submandibular, tonsillar,  preauricular or posterior auricular adenopathy.     Left side of head: No submental, submandibular, tonsillar, preauricular or posterior auricular adenopathy.     Cervical: No cervical adenopathy.     Upper Body:     Right upper body: No supraclavicular, axillary or pectoral adenopathy.     Left upper body: No supraclavicular, axillary or pectoral adenopathy.  Skin:  General: Skin is warm and dry.     Capillary Refill: Capillary refill takes less than 2 seconds.     Findings: No rash.  Neurological:     Mental Status: She is alert and oriented to person, place, and time.     Cranial Nerves: Cranial nerves 2-12 are intact.     Motor: Motor function is intact.     Coordination: Coordination is intact.     Gait: Gait is intact.     Deep Tendon Reflexes: Reflexes are normal and symmetric.     Reflex Scores:      Brachioradialis reflexes are 2+ on the right side and 2+ on the left side.      Patellar reflexes are 2+ on the right side and 2+ on the left side. Psychiatric:        Attention and Perception: Attention normal.        Mood and Affect: Mood normal.        Speech: Speech normal.        Behavior: Behavior normal. Behavior is cooperative.        Thought Content: Thought content normal.        Judgment: Judgment normal.    Results for orders placed or performed in visit on 04/12/23  CBC with Differential/Platelet   Collection Time: 04/12/23  9:02 AM  Result Value Ref Range   WBC 5.9 3.4 - 10.8 x10E3/uL   RBC 4.16 3.77 - 5.28 x10E6/uL   Hemoglobin 13.1 11.1 - 15.9 g/dL   Hematocrit 59.7 65.9 - 46.6 %   MCV 97 79 - 97 fL   MCH 31.5 26.6 - 33.0 pg   MCHC 32.6 31.5 - 35.7 g/dL   RDW 87.9 88.2 - 84.5 %   Platelets 278 150 - 450 x10E3/uL   Neutrophils 36 Not Estab. %   Lymphs 55 Not Estab. %   Monocytes 8 Not Estab. %   Eos 1 Not Estab. %   Basos 0 Not Estab. %   Neutrophils Absolute 2.1 1.4 - 7.0 x10E3/uL   Lymphocytes Absolute 3.3 (H) 0.7 - 3.1 x10E3/uL   Monocytes  Absolute 0.4 0.1 - 0.9 x10E3/uL   EOS (ABSOLUTE) 0.1 0.0 - 0.4 x10E3/uL   Basophils Absolute 0.0 0.0 - 0.2 x10E3/uL   Immature Granulocytes 0 Not Estab. %   Immature Grans (Abs) 0.0 0.0 - 0.1 x10E3/uL  Comprehensive metabolic panel   Collection Time: 04/12/23  9:02 AM  Result Value Ref Range   Glucose 75 70 - 99 mg/dL   BUN 14 6 - 20 mg/dL   Creatinine, Ser 9.33 0.57 - 1.00 mg/dL   eGFR 871 >40 fO/fpw/8.26   BUN/Creatinine Ratio 21 9 - 23   Sodium 141 134 - 144 mmol/L   Potassium 4.0 3.5 - 5.2 mmol/L   Chloride 101 96 - 106 mmol/L   CO2 26 20 - 29 mmol/L   Calcium  9.1 8.7 - 10.2 mg/dL   Total Protein 6.0 6.0 - 8.5 g/dL   Albumin 3.9 (L) 4.0 - 5.0 g/dL   Globulin, Total 2.1 1.5 - 4.5 g/dL   Bilirubin Total <9.7 0.0 - 1.2 mg/dL   Alkaline Phosphatase 52 44 - 121 IU/L   AST 24 0 - 40 IU/L   ALT 39 (H) 0 - 32 IU/L  TSH   Collection Time: 04/12/23  9:02 AM  Result Value Ref Range   TSH 2.360 0.450 - 4.500 uIU/mL  Lipid Panel w/o Chol/HDL Ratio   Collection Time: 04/12/23  9:02 AM  Result Value Ref Range   Cholesterol, Total 174 100 - 199 mg/dL   Triglycerides 883 0 - 149 mg/dL   HDL 47 >60 mg/dL   VLDL Cholesterol Cal 21 5 - 40 mg/dL   LDL Chol Calc (NIH) 893 (H) 0 - 99 mg/dL      Assessment & Plan:   Problem List Items Addressed This Visit       Cardiovascular and Mediastinum   Intractable migraine without aura and without status migrainosus - Primary   Chronic, stable.  Followed by neurology in past, last visit in 2022.  Last note reviewed.  Will refill her medication today: Nurtec and Toradol , since recent concussion has triggered migraines more. Referral to neurology for further assessment and recommendations. Neuro exam reassuring today.      Relevant Medications   ketorolac  (TORADOL ) 10 MG tablet   Rimegepant Sulfate (NURTEC) 75 MG TBDP   Other Relevant Orders   CBC with Differential/Platelet   TSH   Ambulatory referral to Neurology     Other   Syncope   On  04/04/24 with concussion. Neuro exam reassuring today. Will check labs. Referral to neurology for further assessment and recommendations.      Relevant Orders   CBC with Differential/Platelet   Ferritin   Iron   Comprehensive metabolic panel with GFR   Vitamin B12   Ambulatory referral to Neurology   Elevated low density lipoprotein (LDL) cholesterol level   Noted on past labs, recheck today.  Continue diet and exercise focus.      Relevant Orders   Lipid Panel w/o Chol/HDL Ratio   Other Visit Diagnoses       Encounter for annual physical exam       Annual physical today with labs and health maintenance reviewed, discussed with patient.        Follow up plan: Return in about 6 months (around 10/10/2024) for Syncope and migraines.   LABORATORY TESTING:  - Pap smear: will get with gynecology  IMMUNIZATIONS:   - Tdap: Tetanus vaccination status reviewed: Up To Date, last on 02/23/24 - Influenza: Refused - Pneumovax: Not applicable - Prevnar: Not applicable - COVID: Refused - HPV: Up to date - Shingrix vaccine: Not applicable  SCREENING: -Mammogram: Not applicable  - Colonoscopy: Not applicable  - Bone Density: Not applicable  -Hearing Test: Not applicable  -Spirometry: Not applicable   PATIENT COUNSELING:   Advised to take 1 mg of folate supplement per day if capable of pregnancy.   Sexuality: Discussed sexually transmitted diseases, partner selection, use of condoms, avoidance of unintended pregnancy  and contraceptive alternatives.   Advised to avoid cigarette smoking.  I discussed with the patient that most people either abstain from alcohol or drink within safe limits (<=14/week and <=4 drinks/occasion for males, <=7/weeks and <= 3 drinks/occasion for females) and that the risk for alcohol disorders and other health effects rises proportionally with the number of drinks per week and how often a drinker exceeds daily limits.  Discussed cessation/primary  prevention of drug use and availability of treatment for abuse.   Diet: Encouraged to adjust caloric intake to maintain  or achieve ideal body weight, to reduce intake of dietary saturated fat and total fat, to limit sodium intake by avoiding high sodium foods and not adding table salt, and to maintain adequate dietary potassium and calcium  preferably from fresh fruits, vegetables, and low-fat dairy products.    Stressed the importance of regular exercise  Injury prevention: Discussed  safety belts, safety helmets, smoke detector, smoking near bedding or upholstery.   Dental health: Discussed importance of regular tooth brushing, flossing, and dental visits.    NEXT PREVENTATIVE PHYSICAL DUE IN 1 YEAR. Return in about 6 months (around 10/10/2024) for Syncope and migraines.

## 2024-04-11 NOTE — Assessment & Plan Note (Signed)
 On 04/04/24 with concussion. Neuro exam reassuring today. Will check labs. Referral to neurology for further assessment and recommendations.

## 2024-04-12 ENCOUNTER — Other Ambulatory Visit (HOSPITAL_COMMUNITY): Payer: Self-pay

## 2024-04-12 ENCOUNTER — Telehealth: Payer: Self-pay | Admitting: Nurse Practitioner

## 2024-04-12 ENCOUNTER — Telehealth: Payer: Self-pay

## 2024-04-12 DIAGNOSIS — F4322 Adjustment disorder with anxiety: Secondary | ICD-10-CM | POA: Diagnosis not present

## 2024-04-12 LAB — CBC WITH DIFFERENTIAL/PLATELET
Basophils Absolute: 0 x10E3/uL (ref 0.0–0.2)
Basos: 0 %
EOS (ABSOLUTE): 0.1 x10E3/uL (ref 0.0–0.4)
Eos: 2 %
Hematocrit: 36.6 % (ref 34.0–46.6)
Hemoglobin: 12 g/dL (ref 11.1–15.9)
Immature Grans (Abs): 0 x10E3/uL (ref 0.0–0.1)
Immature Granulocytes: 0 %
Lymphocytes Absolute: 1.5 x10E3/uL (ref 0.7–3.1)
Lymphs: 37 %
MCH: 31.7 pg (ref 26.6–33.0)
MCHC: 32.8 g/dL (ref 31.5–35.7)
MCV: 97 fL (ref 79–97)
Monocytes Absolute: 0.5 x10E3/uL (ref 0.1–0.9)
Monocytes: 12 %
Neutrophils Absolute: 1.9 x10E3/uL (ref 1.4–7.0)
Neutrophils: 49 %
Platelets: 257 x10E3/uL (ref 150–450)
RBC: 3.78 x10E6/uL (ref 3.77–5.28)
RDW: 11.4 % — ABNORMAL LOW (ref 11.7–15.4)
WBC: 3.9 x10E3/uL (ref 3.4–10.8)

## 2024-04-12 LAB — COMPREHENSIVE METABOLIC PANEL WITH GFR
ALT: 24 IU/L (ref 0–32)
AST: 24 IU/L (ref 0–40)
Albumin: 4.5 g/dL (ref 4.0–5.0)
Alkaline Phosphatase: 49 IU/L (ref 41–116)
BUN/Creatinine Ratio: 23 (ref 9–23)
BUN: 15 mg/dL (ref 6–20)
Bilirubin Total: 0.3 mg/dL (ref 0.0–1.2)
CO2: 22 mmol/L (ref 20–29)
Calcium: 9.3 mg/dL (ref 8.7–10.2)
Chloride: 104 mmol/L (ref 96–106)
Creatinine, Ser: 0.65 mg/dL (ref 0.57–1.00)
Globulin, Total: 2.4 g/dL (ref 1.5–4.5)
Glucose: 94 mg/dL (ref 70–99)
Potassium: 4.1 mmol/L (ref 3.5–5.2)
Sodium: 139 mmol/L (ref 134–144)
Total Protein: 6.9 g/dL (ref 6.0–8.5)
eGFR: 128 mL/min/1.73 (ref >59)

## 2024-04-12 LAB — LIPID PANEL W/O CHOL/HDL RATIO
Cholesterol, Total: 190 mg/dL (ref 100–199)
HDL: 63 mg/dL (ref >39)
LDL Chol Calc (NIH): 116 mg/dL — ABNORMAL HIGH (ref 0–99)
Triglycerides: 61 mg/dL (ref 0–149)
VLDL Cholesterol Cal: 11 mg/dL (ref 5–40)

## 2024-04-12 LAB — IRON: Iron: 46 ug/dL (ref 27–159)

## 2024-04-12 LAB — FERRITIN: Ferritin: 14 ng/mL — ABNORMAL LOW (ref 15–150)

## 2024-04-12 LAB — VITAMIN B12: Vitamin B-12: 1166 pg/mL (ref 232–1245)

## 2024-04-12 LAB — TSH: TSH: 1.15 u[IU]/mL (ref 0.450–4.500)

## 2024-04-12 NOTE — Telephone Encounter (Signed)
 Copied from CRM #[id number]. Topic: Clinical - Prescription Issue >> Apr 12, 2024  9:21 AM Lonell PEDLAR wrote: Reason for CRM: patient went to pick up rx for Rimegepant Sulfate (NURTEC) 75 MG TBDP from pharmacy and they stated it was $3,000.  Patient is wondering if something can be done, as she has not paid this amount before, she has pain at most $30.  Please c/b to advise: [phone]

## 2024-04-12 NOTE — Progress Notes (Signed)
 Contacted via MyChart  Good morning Paula Brock, your labs have returned: - CBC shows normal hemoglobin and hematocrit, but RDW mildly low and ferritin low. Iron is on lower side of normal. You may benefit taking an iron supplement like Vitron of Slow Fe a few days a week to help levels. - Kidney function, creatinine and eGFR, remains normal, as is liver function, AST and ALT.  - Remainder of labs stable with exception of LDL, bad cholesterol, which remains mildly elevated. Continue focus on diet and exercise. Any questions? Keep being amazing!!  Thank you for allowing me to participate in your care.  I appreciate you. Kindest regards, Aundreya Souffrant

## 2024-04-12 NOTE — Telephone Encounter (Signed)
 Pharmacy Patient Advocate Encounter   Received notification from Physician's Office that prior authorization for Nurtec 75MG  dispersible tablets  is required/requested.   Insurance verification completed.   The patient is insured through [facility].   Per test claim: Per test claim, medication is not covered due to plan/benefit exclusion, PA not submitted at this time

## 2024-04-12 NOTE — Telephone Encounter (Signed)
 Hi, Can we double check this does not require a PA before we consider other options. Thank you!

## 2024-04-12 NOTE — Telephone Encounter (Unsigned)
 Copied from CRM #[id number]. Topic: Clinical - Prescription Issue >> Apr 12, 2024  9:21 AM Lonell PEDLAR wrote: Reason for CRM: patient went to pick up rx for Rimegepant Sulfate (NURTEC) 75 MG TBDP from pharmacy and they stated it was $3,000.  Patient is wondering if something can be done, as she has not paid this amount before, she has pain at most $30.  Please c/b to advise: [phone]

## 2024-04-13 NOTE — Addendum Note (Signed)
 Addended by: Adja Ruff T on: 04/13/2024 01:55 PM   Modules accepted: Orders

## 2024-04-13 NOTE — Telephone Encounter (Signed)
 Pt returned the call and verbalizes understanding, plans to follow up with Neurology.

## 2024-04-13 NOTE — Telephone Encounter (Signed)
 Patient has been called and a message left for them to return the call to the office. Ok for E2C2 to review if/when they return the call. Please do not transfer to CAL rather send a CRM if needed only.  Please advise patient if/when she returned call about the medication not being covered by insurance.

## 2024-10-09 ENCOUNTER — Ambulatory Visit: Admitting: Nurse Practitioner
